# Patient Record
Sex: Female | Born: 1955 | Race: White | Hispanic: No | Smoking: Current some day smoker
Health system: Southern US, Community
[De-identification: ages and names within clinical notes are randomized; demographics above are authoritative.]

## PROBLEM LIST (undated history)

## (undated) DIAGNOSIS — R561 Post traumatic seizures: Secondary | ICD-10-CM

## (undated) DIAGNOSIS — M51369 Other intervertebral disc degeneration, lumbar region without mention of lumbar back pain or lower extremity pain: Secondary | ICD-10-CM

## (undated) DIAGNOSIS — M1990 Unspecified osteoarthritis, unspecified site: Secondary | ICD-10-CM

## (undated) DIAGNOSIS — M419 Scoliosis, unspecified: Secondary | ICD-10-CM

## (undated) DIAGNOSIS — F419 Anxiety disorder, unspecified: Secondary | ICD-10-CM

## (undated) DIAGNOSIS — B1920 Unspecified viral hepatitis C without hepatic coma: Secondary | ICD-10-CM

## (undated) DIAGNOSIS — J449 Chronic obstructive pulmonary disease, unspecified: Secondary | ICD-10-CM

## (undated) DIAGNOSIS — E0500 Thyrotoxicosis with diffuse goiter without thyrotoxic crisis or storm: Secondary | ICD-10-CM

## (undated) DIAGNOSIS — M549 Dorsalgia, unspecified: Secondary | ICD-10-CM

## (undated) DIAGNOSIS — M109 Gout, unspecified: Secondary | ICD-10-CM

## (undated) DIAGNOSIS — F329 Major depressive disorder, single episode, unspecified: Secondary | ICD-10-CM

## (undated) DIAGNOSIS — R413 Other amnesia: Secondary | ICD-10-CM

## (undated) DIAGNOSIS — N183 Chronic kidney disease, stage 3 unspecified: Secondary | ICD-10-CM

## (undated) DIAGNOSIS — G5790 Unspecified mononeuropathy of unspecified lower limb: Secondary | ICD-10-CM

## (undated) DIAGNOSIS — M797 Fibromyalgia: Secondary | ICD-10-CM

## (undated) DIAGNOSIS — G4700 Insomnia, unspecified: Secondary | ICD-10-CM

## (undated) DIAGNOSIS — G8929 Other chronic pain: Secondary | ICD-10-CM

## (undated) DIAGNOSIS — F4310 Post-traumatic stress disorder, unspecified: Secondary | ICD-10-CM

## (undated) DIAGNOSIS — K219 Gastro-esophageal reflux disease without esophagitis: Secondary | ICD-10-CM

## (undated) DIAGNOSIS — G629 Polyneuropathy, unspecified: Secondary | ICD-10-CM

## (undated) DIAGNOSIS — I10 Essential (primary) hypertension: Secondary | ICD-10-CM

## (undated) DIAGNOSIS — M542 Cervicalgia: Secondary | ICD-10-CM

## (undated) DIAGNOSIS — E785 Hyperlipidemia, unspecified: Secondary | ICD-10-CM

## (undated) HISTORY — DX: Essential (primary) hypertension: I10

## (undated) HISTORY — DX: Other intervertebral disc degeneration, lumbar region without mention of lumbar back pain or lower extremity pain: M51.369

## (undated) HISTORY — DX: Scoliosis, unspecified: M41.9

## (undated) HISTORY — DX: Gastro-esophageal reflux disease without esophagitis: K21.9

## (undated) HISTORY — DX: Polyneuropathy, unspecified: G62.9

## (undated) HISTORY — DX: Unspecified osteoarthritis, unspecified site: M19.90

## (undated) HISTORY — DX: Other amnesia: R41.3

## (undated) HISTORY — PX: NECK SURGERY: SHX720

## (undated) HISTORY — DX: Other chronic pain: G89.29

## (undated) HISTORY — DX: Fibromyalgia: M79.7

## (undated) HISTORY — DX: Post-traumatic stress disorder, unspecified: F43.10

## (undated) HISTORY — DX: Gout, unspecified: M10.9

## (undated) HISTORY — DX: Unspecified mononeuropathy of unspecified lower limb: G57.90

## (undated) HISTORY — DX: Major depressive disorder, single episode, unspecified: F32.9

## (undated) HISTORY — DX: Thyrotoxicosis with diffuse goiter without thyrotoxic crisis or storm: E05.00

## (undated) HISTORY — DX: Chronic obstructive pulmonary disease, unspecified: J44.9

## (undated) HISTORY — DX: Anxiety disorder, unspecified: F41.9

## (undated) HISTORY — DX: Hyperlipidemia, unspecified: E78.5

## (undated) HISTORY — DX: Insomnia, unspecified: G47.00

## (undated) HISTORY — PX: OTHER SURGICAL HISTORY: SHX169

## (undated) HISTORY — DX: Chronic kidney disease, stage 3 unspecified: N18.30

## (undated) HISTORY — DX: Unspecified viral hepatitis C without hepatic coma: B19.20

## (undated) HISTORY — DX: Post traumatic seizures: R56.1

## (undated) HISTORY — DX: Dorsalgia, unspecified: M54.9

## (undated) HISTORY — DX: Cervicalgia: M54.2

---

## 1980-12-19 DIAGNOSIS — M1990 Unspecified osteoarthritis, unspecified site: Secondary | ICD-10-CM

## 1980-12-19 HISTORY — DX: Unspecified osteoarthritis, unspecified site: M19.90

## 1996-12-19 DIAGNOSIS — F32A Depression, unspecified: Secondary | ICD-10-CM

## 1996-12-19 HISTORY — DX: Depression, unspecified: F32.A

## 1998-09-02 ENCOUNTER — Ambulatory Visit (HOSPITAL_COMMUNITY): Admission: RE | Admit: 1998-09-02 | Discharge: 1998-09-02 | Payer: Self-pay | Admitting: Neurology

## 1998-11-04 ENCOUNTER — Encounter (HOSPITAL_COMMUNITY): Admission: RE | Admit: 1998-11-04 | Discharge: 1999-02-02 | Payer: Self-pay | Admitting: Gastroenterology

## 2000-03-06 ENCOUNTER — Encounter: Payer: Self-pay | Admitting: *Deleted

## 2000-03-06 ENCOUNTER — Ambulatory Visit (HOSPITAL_COMMUNITY): Admission: RE | Admit: 2000-03-06 | Discharge: 2000-03-06 | Payer: Self-pay | Admitting: *Deleted

## 2000-03-06 ENCOUNTER — Encounter (INDEPENDENT_AMBULATORY_CARE_PROVIDER_SITE_OTHER): Payer: Self-pay

## 2000-04-29 ENCOUNTER — Ambulatory Visit (HOSPITAL_COMMUNITY): Admission: RE | Admit: 2000-04-29 | Discharge: 2000-04-29 | Payer: Self-pay | Admitting: *Deleted

## 2000-04-29 ENCOUNTER — Encounter: Payer: Self-pay | Admitting: *Deleted

## 2001-03-19 ENCOUNTER — Encounter: Admission: RE | Admit: 2001-03-19 | Discharge: 2001-03-19 | Payer: Self-pay | Admitting: Family Medicine

## 2001-05-02 ENCOUNTER — Emergency Department (HOSPITAL_COMMUNITY): Admission: EM | Admit: 2001-05-02 | Discharge: 2001-05-02 | Payer: Self-pay | Admitting: Internal Medicine

## 2001-05-27 ENCOUNTER — Ambulatory Visit (HOSPITAL_COMMUNITY): Admission: RE | Admit: 2001-05-27 | Discharge: 2001-05-27 | Payer: Self-pay | Admitting: Orthopaedic Surgery

## 2001-05-27 ENCOUNTER — Encounter: Payer: Self-pay | Admitting: Orthopaedic Surgery

## 2002-06-10 ENCOUNTER — Encounter: Payer: Self-pay | Admitting: Family Medicine

## 2002-06-10 ENCOUNTER — Ambulatory Visit (HOSPITAL_COMMUNITY): Admission: RE | Admit: 2002-06-10 | Discharge: 2002-06-10 | Payer: Self-pay | Admitting: Family Medicine

## 2002-06-26 ENCOUNTER — Ambulatory Visit (HOSPITAL_COMMUNITY): Admission: RE | Admit: 2002-06-26 | Discharge: 2002-06-26 | Payer: Self-pay | Admitting: Family Medicine

## 2002-06-26 ENCOUNTER — Encounter: Payer: Self-pay | Admitting: Family Medicine

## 2003-01-11 ENCOUNTER — Emergency Department (HOSPITAL_COMMUNITY): Admission: EM | Admit: 2003-01-11 | Discharge: 2003-01-11 | Payer: Self-pay | Admitting: *Deleted

## 2004-05-14 ENCOUNTER — Ambulatory Visit (HOSPITAL_COMMUNITY): Admission: RE | Admit: 2004-05-14 | Discharge: 2004-05-14 | Payer: Self-pay | Admitting: Family Medicine

## 2004-12-19 ENCOUNTER — Emergency Department (HOSPITAL_COMMUNITY): Admission: EM | Admit: 2004-12-19 | Discharge: 2004-12-19 | Payer: Self-pay | Admitting: Emergency Medicine

## 2005-08-04 ENCOUNTER — Ambulatory Visit (HOSPITAL_COMMUNITY): Admission: RE | Admit: 2005-08-04 | Discharge: 2005-08-04 | Payer: Self-pay | Admitting: Family Medicine

## 2005-08-18 ENCOUNTER — Ambulatory Visit (HOSPITAL_COMMUNITY): Admission: RE | Admit: 2005-08-18 | Discharge: 2005-08-18 | Payer: Self-pay | Admitting: Family Medicine

## 2006-01-14 ENCOUNTER — Emergency Department (HOSPITAL_COMMUNITY): Admission: EM | Admit: 2006-01-14 | Discharge: 2006-01-14 | Payer: Self-pay | Admitting: Emergency Medicine

## 2006-09-21 ENCOUNTER — Ambulatory Visit (HOSPITAL_COMMUNITY): Admission: RE | Admit: 2006-09-21 | Discharge: 2006-09-21 | Payer: Self-pay | Admitting: Neurosurgery

## 2006-12-19 DIAGNOSIS — E785 Hyperlipidemia, unspecified: Secondary | ICD-10-CM

## 2006-12-19 DIAGNOSIS — I10 Essential (primary) hypertension: Secondary | ICD-10-CM

## 2006-12-19 HISTORY — DX: Hyperlipidemia, unspecified: E78.5

## 2006-12-19 HISTORY — DX: Essential (primary) hypertension: I10

## 2007-01-05 ENCOUNTER — Emergency Department (HOSPITAL_COMMUNITY): Admission: EM | Admit: 2007-01-05 | Discharge: 2007-01-05 | Payer: Self-pay | Admitting: Emergency Medicine

## 2007-03-23 ENCOUNTER — Emergency Department (HOSPITAL_COMMUNITY): Admission: EM | Admit: 2007-03-23 | Discharge: 2007-03-23 | Payer: Self-pay | Admitting: Emergency Medicine

## 2007-04-01 ENCOUNTER — Emergency Department (HOSPITAL_COMMUNITY): Admission: EM | Admit: 2007-04-01 | Discharge: 2007-04-01 | Payer: Self-pay | Admitting: Family Medicine

## 2007-04-01 ENCOUNTER — Emergency Department (HOSPITAL_COMMUNITY): Admission: EM | Admit: 2007-04-01 | Discharge: 2007-04-01 | Payer: Self-pay | Admitting: Emergency Medicine

## 2007-04-17 ENCOUNTER — Ambulatory Visit (HOSPITAL_COMMUNITY): Admission: RE | Admit: 2007-04-17 | Discharge: 2007-04-17 | Payer: Self-pay | Admitting: Family Medicine

## 2007-05-16 ENCOUNTER — Emergency Department (HOSPITAL_COMMUNITY): Admission: EM | Admit: 2007-05-16 | Discharge: 2007-05-16 | Payer: Self-pay | Admitting: Emergency Medicine

## 2007-07-04 ENCOUNTER — Emergency Department (HOSPITAL_COMMUNITY): Admission: EM | Admit: 2007-07-04 | Discharge: 2007-07-04 | Payer: Self-pay | Admitting: Emergency Medicine

## 2007-08-02 ENCOUNTER — Emergency Department (HOSPITAL_COMMUNITY): Admission: EM | Admit: 2007-08-02 | Discharge: 2007-08-02 | Payer: Self-pay | Admitting: Emergency Medicine

## 2007-08-19 ENCOUNTER — Emergency Department (HOSPITAL_COMMUNITY): Admission: EM | Admit: 2007-08-19 | Discharge: 2007-08-19 | Payer: Self-pay | Admitting: Emergency Medicine

## 2007-09-10 ENCOUNTER — Inpatient Hospital Stay (HOSPITAL_COMMUNITY): Admission: EM | Admit: 2007-09-10 | Discharge: 2007-09-12 | Payer: Self-pay | Admitting: Emergency Medicine

## 2007-09-12 ENCOUNTER — Ambulatory Visit: Payer: Self-pay | Admitting: *Deleted

## 2007-09-12 ENCOUNTER — Inpatient Hospital Stay (HOSPITAL_COMMUNITY): Admission: RE | Admit: 2007-09-12 | Discharge: 2007-09-17 | Payer: Self-pay | Admitting: *Deleted

## 2007-09-24 ENCOUNTER — Encounter
Admission: RE | Admit: 2007-09-24 | Discharge: 2007-12-23 | Payer: Self-pay | Admitting: Physical Medicine & Rehabilitation

## 2007-09-25 ENCOUNTER — Ambulatory Visit: Payer: Self-pay | Admitting: Physical Medicine & Rehabilitation

## 2007-12-31 ENCOUNTER — Other Ambulatory Visit: Admission: RE | Admit: 2007-12-31 | Discharge: 2007-12-31 | Payer: Self-pay | Admitting: Obstetrics and Gynecology

## 2008-02-07 ENCOUNTER — Encounter
Admission: RE | Admit: 2008-02-07 | Discharge: 2008-02-15 | Payer: Self-pay | Admitting: Physical Medicine & Rehabilitation

## 2008-02-07 ENCOUNTER — Ambulatory Visit: Payer: Self-pay | Admitting: Physical Medicine & Rehabilitation

## 2008-03-14 ENCOUNTER — Encounter
Admission: RE | Admit: 2008-03-14 | Discharge: 2008-03-14 | Payer: Self-pay | Admitting: Physical Medicine & Rehabilitation

## 2008-03-14 ENCOUNTER — Ambulatory Visit: Payer: Self-pay | Admitting: Physical Medicine & Rehabilitation

## 2008-04-21 ENCOUNTER — Encounter (HOSPITAL_COMMUNITY): Admission: RE | Admit: 2008-04-21 | Discharge: 2008-05-21 | Payer: Self-pay | Admitting: Neurology

## 2008-04-23 ENCOUNTER — Ambulatory Visit (HOSPITAL_COMMUNITY): Admission: RE | Admit: 2008-04-23 | Discharge: 2008-04-23 | Payer: Self-pay | Admitting: Anesthesiology

## 2008-04-24 ENCOUNTER — Ambulatory Visit (HOSPITAL_COMMUNITY): Admission: RE | Admit: 2008-04-24 | Discharge: 2008-04-24 | Payer: Self-pay | Admitting: Anesthesiology

## 2008-05-13 ENCOUNTER — Ambulatory Visit (HOSPITAL_COMMUNITY): Admission: RE | Admit: 2008-05-13 | Discharge: 2008-05-13 | Payer: Self-pay | Admitting: Obstetrics and Gynecology

## 2008-05-22 ENCOUNTER — Encounter (HOSPITAL_COMMUNITY): Admission: RE | Admit: 2008-05-22 | Discharge: 2008-06-21 | Payer: Self-pay | Admitting: Neurology

## 2008-09-09 ENCOUNTER — Encounter: Admission: RE | Admit: 2008-09-09 | Discharge: 2008-09-09 | Payer: Self-pay | Admitting: Gastroenterology

## 2008-10-22 LAB — HM COLONOSCOPY

## 2009-01-14 ENCOUNTER — Other Ambulatory Visit: Admission: RE | Admit: 2009-01-14 | Discharge: 2009-01-14 | Payer: Self-pay | Admitting: Obstetrics and Gynecology

## 2009-01-28 ENCOUNTER — Emergency Department (HOSPITAL_COMMUNITY): Admission: EM | Admit: 2009-01-28 | Discharge: 2009-01-28 | Payer: Self-pay | Admitting: Emergency Medicine

## 2009-03-31 ENCOUNTER — Ambulatory Visit: Payer: Self-pay | Admitting: Family Medicine

## 2009-03-31 DIAGNOSIS — E785 Hyperlipidemia, unspecified: Secondary | ICD-10-CM | POA: Insufficient documentation

## 2009-03-31 DIAGNOSIS — K219 Gastro-esophageal reflux disease without esophagitis: Secondary | ICD-10-CM | POA: Insufficient documentation

## 2009-03-31 DIAGNOSIS — B1710 Acute hepatitis C without hepatic coma: Secondary | ICD-10-CM

## 2009-03-31 DIAGNOSIS — F341 Dysthymic disorder: Secondary | ICD-10-CM

## 2009-03-31 DIAGNOSIS — I10 Essential (primary) hypertension: Secondary | ICD-10-CM | POA: Insufficient documentation

## 2009-03-31 DIAGNOSIS — E669 Obesity, unspecified: Secondary | ICD-10-CM | POA: Insufficient documentation

## 2009-03-31 DIAGNOSIS — IMO0001 Reserved for inherently not codable concepts without codable children: Secondary | ICD-10-CM

## 2009-03-31 DIAGNOSIS — F17200 Nicotine dependence, unspecified, uncomplicated: Secondary | ICD-10-CM | POA: Insufficient documentation

## 2009-03-31 DIAGNOSIS — M51379 Other intervertebral disc degeneration, lumbosacral region without mention of lumbar back pain or lower extremity pain: Secondary | ICD-10-CM | POA: Insufficient documentation

## 2009-03-31 DIAGNOSIS — R413 Other amnesia: Secondary | ICD-10-CM

## 2009-03-31 DIAGNOSIS — F4310 Post-traumatic stress disorder, unspecified: Secondary | ICD-10-CM

## 2009-03-31 DIAGNOSIS — M5137 Other intervertebral disc degeneration, lumbosacral region: Secondary | ICD-10-CM

## 2009-03-31 DIAGNOSIS — R809 Proteinuria, unspecified: Secondary | ICD-10-CM | POA: Insufficient documentation

## 2009-03-31 DIAGNOSIS — K5900 Constipation, unspecified: Secondary | ICD-10-CM | POA: Insufficient documentation

## 2009-03-31 DIAGNOSIS — S229XXA Fracture of bony thorax, part unspecified, initial encounter for closed fracture: Secondary | ICD-10-CM

## 2009-03-31 LAB — CONVERTED CEMR LAB
Bilirubin Urine: NEGATIVE
Nitrite: NEGATIVE
Specific Gravity, Urine: 1.02
pH: 7

## 2009-04-01 ENCOUNTER — Encounter (INDEPENDENT_AMBULATORY_CARE_PROVIDER_SITE_OTHER): Payer: Self-pay | Admitting: Family Medicine

## 2009-04-02 ENCOUNTER — Encounter (INDEPENDENT_AMBULATORY_CARE_PROVIDER_SITE_OTHER): Payer: Self-pay | Admitting: Family Medicine

## 2009-04-09 ENCOUNTER — Encounter (INDEPENDENT_AMBULATORY_CARE_PROVIDER_SITE_OTHER): Payer: Self-pay | Admitting: Family Medicine

## 2009-04-14 ENCOUNTER — Ambulatory Visit: Payer: Self-pay | Admitting: Family Medicine

## 2009-04-17 ENCOUNTER — Ambulatory Visit (HOSPITAL_COMMUNITY): Admission: RE | Admit: 2009-04-17 | Discharge: 2009-04-17 | Payer: Self-pay | Admitting: Family Medicine

## 2009-04-17 ENCOUNTER — Encounter (INDEPENDENT_AMBULATORY_CARE_PROVIDER_SITE_OTHER): Payer: Self-pay | Admitting: Family Medicine

## 2009-04-20 ENCOUNTER — Ambulatory Visit (HOSPITAL_COMMUNITY): Payer: Self-pay | Admitting: Psychology

## 2009-04-20 ENCOUNTER — Ambulatory Visit: Payer: Self-pay | Admitting: Family Medicine

## 2009-04-20 DIAGNOSIS — M940 Chondrocostal junction syndrome [Tietze]: Secondary | ICD-10-CM

## 2009-04-21 ENCOUNTER — Encounter (INDEPENDENT_AMBULATORY_CARE_PROVIDER_SITE_OTHER): Payer: Self-pay | Admitting: Family Medicine

## 2009-04-22 ENCOUNTER — Encounter (INDEPENDENT_AMBULATORY_CARE_PROVIDER_SITE_OTHER): Payer: Self-pay | Admitting: Family Medicine

## 2009-04-27 ENCOUNTER — Encounter (INDEPENDENT_AMBULATORY_CARE_PROVIDER_SITE_OTHER): Payer: Self-pay | Admitting: Family Medicine

## 2009-05-12 ENCOUNTER — Ambulatory Visit: Payer: Self-pay | Admitting: Family Medicine

## 2009-05-12 ENCOUNTER — Ambulatory Visit (HOSPITAL_COMMUNITY): Payer: Self-pay | Admitting: Psychology

## 2009-05-21 ENCOUNTER — Encounter (INDEPENDENT_AMBULATORY_CARE_PROVIDER_SITE_OTHER): Payer: Self-pay | Admitting: Family Medicine

## 2009-05-25 ENCOUNTER — Encounter (INDEPENDENT_AMBULATORY_CARE_PROVIDER_SITE_OTHER): Payer: Self-pay | Admitting: Family Medicine

## 2009-05-26 LAB — CONVERTED CEMR LAB
ALT: 14 U/L
AST: 21 U/L
Albumin: 4.1 g/dL
Alkaline Phosphatase: 91 U/L
BUN: 6 mg/dL
CO2: 23 meq/L
Calcium: 9.5 mg/dL
Chloride: 105 meq/L
Cholesterol: 185 mg/dL
Creatinine, Ser: 0.63 mg/dL
Glucose, Bld: 89 mg/dL
HCT: 44.9 %
HDL: 40 mg/dL
Hemoglobin: 15.1 g/dL — ABNORMAL HIGH
LDL Cholesterol: 126 mg/dL — ABNORMAL HIGH
MCHC: 33.6 g/dL
MCV: 79.8 fL
Platelets: 265 K/uL
Potassium: 3.9 meq/L
RBC: 5.63 M/uL — ABNORMAL HIGH
RDW: 14.5 %
Sodium: 143 meq/L
TSH: 0.324 u[IU]/mL — ABNORMAL LOW
Total Bilirubin: 0.3 mg/dL
Total CHOL/HDL Ratio: 4.6
Total Protein: 7.2 g/dL
Triglycerides: 96 mg/dL
VLDL: 19 mg/dL
WBC: 5.4 10*3/microliter

## 2009-05-27 ENCOUNTER — Encounter (INDEPENDENT_AMBULATORY_CARE_PROVIDER_SITE_OTHER): Payer: Self-pay | Admitting: Family Medicine

## 2009-05-28 ENCOUNTER — Ambulatory Visit (HOSPITAL_COMMUNITY): Payer: Self-pay | Admitting: Psychiatry

## 2009-06-04 ENCOUNTER — Ambulatory Visit (HOSPITAL_COMMUNITY): Payer: Self-pay | Admitting: Psychology

## 2009-06-09 ENCOUNTER — Ambulatory Visit: Payer: Self-pay | Admitting: Family Medicine

## 2009-06-11 ENCOUNTER — Ambulatory Visit (HOSPITAL_COMMUNITY): Payer: Self-pay | Admitting: Psychiatry

## 2009-06-15 ENCOUNTER — Telehealth (INDEPENDENT_AMBULATORY_CARE_PROVIDER_SITE_OTHER): Payer: Self-pay | Admitting: *Deleted

## 2009-06-15 DIAGNOSIS — E079 Disorder of thyroid, unspecified: Secondary | ICD-10-CM | POA: Insufficient documentation

## 2009-06-16 ENCOUNTER — Ambulatory Visit (HOSPITAL_COMMUNITY): Payer: Self-pay | Admitting: Psychology

## 2009-07-09 ENCOUNTER — Ambulatory Visit (HOSPITAL_COMMUNITY): Payer: Self-pay | Admitting: Psychiatry

## 2009-07-21 ENCOUNTER — Ambulatory Visit: Payer: Self-pay | Admitting: Family Medicine

## 2009-07-21 DIAGNOSIS — K5909 Other constipation: Secondary | ICD-10-CM

## 2009-07-21 DIAGNOSIS — H5310 Unspecified subjective visual disturbances: Secondary | ICD-10-CM | POA: Insufficient documentation

## 2009-07-22 ENCOUNTER — Encounter (INDEPENDENT_AMBULATORY_CARE_PROVIDER_SITE_OTHER): Payer: Self-pay | Admitting: *Deleted

## 2009-07-22 ENCOUNTER — Encounter (INDEPENDENT_AMBULATORY_CARE_PROVIDER_SITE_OTHER): Payer: Self-pay | Admitting: Family Medicine

## 2009-08-14 ENCOUNTER — Ambulatory Visit (HOSPITAL_COMMUNITY): Payer: Self-pay | Admitting: Psychology

## 2009-09-04 ENCOUNTER — Ambulatory Visit (HOSPITAL_COMMUNITY): Payer: Self-pay | Admitting: Psychology

## 2009-09-11 ENCOUNTER — Ambulatory Visit: Payer: Self-pay | Admitting: Internal Medicine

## 2009-09-11 DIAGNOSIS — R1310 Dysphagia, unspecified: Secondary | ICD-10-CM | POA: Insufficient documentation

## 2009-09-11 DIAGNOSIS — R634 Abnormal weight loss: Secondary | ICD-10-CM | POA: Insufficient documentation

## 2009-09-14 ENCOUNTER — Encounter: Payer: Self-pay | Admitting: Internal Medicine

## 2009-09-15 ENCOUNTER — Encounter (INDEPENDENT_AMBULATORY_CARE_PROVIDER_SITE_OTHER): Payer: Self-pay | Admitting: Family Medicine

## 2009-10-01 ENCOUNTER — Ambulatory Visit: Payer: Self-pay | Admitting: Internal Medicine

## 2009-10-01 ENCOUNTER — Ambulatory Visit (HOSPITAL_COMMUNITY): Admission: RE | Admit: 2009-10-01 | Discharge: 2009-10-01 | Payer: Self-pay | Admitting: Internal Medicine

## 2009-10-01 ENCOUNTER — Encounter: Payer: Self-pay | Admitting: Internal Medicine

## 2009-10-01 LAB — HM COLONOSCOPY

## 2009-10-03 ENCOUNTER — Encounter: Payer: Self-pay | Admitting: Internal Medicine

## 2009-10-20 ENCOUNTER — Ambulatory Visit (HOSPITAL_COMMUNITY): Payer: Self-pay | Admitting: Psychiatry

## 2009-10-26 ENCOUNTER — Ambulatory Visit (HOSPITAL_COMMUNITY): Payer: Self-pay | Admitting: Psychology

## 2009-12-02 ENCOUNTER — Ambulatory Visit (HOSPITAL_COMMUNITY): Payer: Self-pay | Admitting: Psychology

## 2009-12-22 ENCOUNTER — Ambulatory Visit (HOSPITAL_COMMUNITY): Payer: Self-pay | Admitting: Psychiatry

## 2009-12-28 ENCOUNTER — Encounter: Payer: Self-pay | Admitting: Gastroenterology

## 2009-12-30 ENCOUNTER — Encounter: Payer: Self-pay | Admitting: Gastroenterology

## 2009-12-30 ENCOUNTER — Ambulatory Visit (HOSPITAL_COMMUNITY): Payer: Self-pay | Admitting: Psychology

## 2009-12-31 ENCOUNTER — Encounter: Payer: Self-pay | Admitting: Gastroenterology

## 2010-01-26 ENCOUNTER — Ambulatory Visit: Payer: Self-pay | Admitting: Family Medicine

## 2010-01-26 DIAGNOSIS — J209 Acute bronchitis, unspecified: Secondary | ICD-10-CM | POA: Insufficient documentation

## 2010-01-26 LAB — CONVERTED CEMR LAB
Bilirubin Urine: NEGATIVE
Blood in Urine, dipstick: NEGATIVE
Glucose, Urine, Semiquant: NEGATIVE
Protein, U semiquant: NEGATIVE
Urobilinogen, UA: 0.2
pH: 5.5

## 2010-02-09 ENCOUNTER — Telehealth: Payer: Self-pay | Admitting: Family Medicine

## 2010-02-16 ENCOUNTER — Ambulatory Visit (HOSPITAL_COMMUNITY): Payer: Self-pay | Admitting: Psychiatry

## 2010-02-17 ENCOUNTER — Telehealth: Payer: Self-pay | Admitting: Family Medicine

## 2010-02-19 ENCOUNTER — Ambulatory Visit (HOSPITAL_COMMUNITY): Admission: RE | Admit: 2010-02-19 | Discharge: 2010-02-19 | Payer: Self-pay | Admitting: Family Medicine

## 2010-02-22 ENCOUNTER — Ambulatory Visit: Payer: Self-pay | Admitting: Family Medicine

## 2010-02-22 LAB — CONVERTED CEMR LAB
AST: 16 units/L (ref 0–37)
Albumin: 4.4 g/dL (ref 3.5–5.2)
CO2: 25 meq/L (ref 19–32)
Chloride: 104 meq/L (ref 96–112)
Glucose, Bld: 76 mg/dL (ref 70–99)
HDL: 51 mg/dL (ref >39)
LDL Cholesterol: 123 mg/dL — ABNORMAL HIGH (ref 0–99)
Potassium: 4 meq/L (ref 3.5–5.3)
Sodium: 139 meq/L (ref 135–145)
Total Bilirubin: 0.4 mg/dL (ref 0.3–1.2)
Total CHOL/HDL Ratio: 3.8
VLDL: 21 mg/dL (ref 0–40)

## 2010-03-11 ENCOUNTER — Encounter (INDEPENDENT_AMBULATORY_CARE_PROVIDER_SITE_OTHER): Payer: Self-pay | Admitting: *Deleted

## 2010-03-11 ENCOUNTER — Telehealth: Payer: Self-pay | Admitting: Family Medicine

## 2010-03-23 ENCOUNTER — Ambulatory Visit: Payer: Self-pay | Admitting: Family Medicine

## 2010-03-23 DIAGNOSIS — M25569 Pain in unspecified knee: Secondary | ICD-10-CM | POA: Insufficient documentation

## 2010-03-26 ENCOUNTER — Telehealth: Payer: Self-pay | Admitting: Family Medicine

## 2010-03-30 ENCOUNTER — Ambulatory Visit (HOSPITAL_COMMUNITY): Payer: Self-pay | Admitting: Psychiatry

## 2010-04-01 ENCOUNTER — Ambulatory Visit (HOSPITAL_COMMUNITY): Payer: Self-pay | Admitting: Psychology

## 2010-06-10 ENCOUNTER — Ambulatory Visit (HOSPITAL_COMMUNITY): Payer: Self-pay | Admitting: Psychiatry

## 2010-07-13 ENCOUNTER — Other Ambulatory Visit: Admission: RE | Admit: 2010-07-13 | Discharge: 2010-07-13 | Payer: Self-pay | Admitting: Family Medicine

## 2010-07-13 ENCOUNTER — Ambulatory Visit: Payer: Self-pay | Admitting: Family Medicine

## 2010-07-13 DIAGNOSIS — J42 Unspecified chronic bronchitis: Secondary | ICD-10-CM | POA: Insufficient documentation

## 2010-07-13 LAB — CONVERTED CEMR LAB: OCCULT 1: NEGATIVE

## 2010-07-15 ENCOUNTER — Encounter: Payer: Self-pay | Admitting: Family Medicine

## 2010-07-15 LAB — CONVERTED CEMR LAB: Pap Smear: NEGATIVE

## 2010-07-18 DIAGNOSIS — B351 Tinea unguium: Secondary | ICD-10-CM | POA: Insufficient documentation

## 2010-07-18 DIAGNOSIS — B369 Superficial mycosis, unspecified: Secondary | ICD-10-CM | POA: Insufficient documentation

## 2010-07-22 ENCOUNTER — Ambulatory Visit (HOSPITAL_COMMUNITY): Admission: RE | Admit: 2010-07-22 | Discharge: 2010-07-22 | Payer: Self-pay | Admitting: Family Medicine

## 2010-08-31 ENCOUNTER — Encounter: Payer: Self-pay | Admitting: Family Medicine

## 2010-09-07 ENCOUNTER — Ambulatory Visit (HOSPITAL_COMMUNITY): Payer: Self-pay | Admitting: Psychiatry

## 2010-09-28 ENCOUNTER — Telehealth: Payer: Self-pay | Admitting: Family Medicine

## 2010-10-12 ENCOUNTER — Telehealth: Payer: Self-pay | Admitting: Family Medicine

## 2010-10-15 ENCOUNTER — Encounter: Payer: Self-pay | Admitting: Family Medicine

## 2010-11-08 ENCOUNTER — Telehealth: Payer: Self-pay | Admitting: Family Medicine

## 2010-11-09 ENCOUNTER — Ambulatory Visit (HOSPITAL_COMMUNITY): Payer: Self-pay | Admitting: Psychiatry

## 2010-11-15 ENCOUNTER — Ambulatory Visit: Payer: Self-pay | Admitting: Family Medicine

## 2010-11-15 DIAGNOSIS — K1370 Unspecified lesions of oral mucosa: Secondary | ICD-10-CM

## 2010-11-20 ENCOUNTER — Emergency Department (HOSPITAL_COMMUNITY)
Admission: EM | Admit: 2010-11-20 | Discharge: 2010-11-20 | Payer: Self-pay | Source: Home / Self Care | Admitting: Emergency Medicine

## 2010-11-20 ENCOUNTER — Encounter: Payer: Self-pay | Admitting: Family Medicine

## 2010-11-23 ENCOUNTER — Ambulatory Visit: Payer: Self-pay | Admitting: Family Medicine

## 2010-11-23 DIAGNOSIS — R079 Chest pain, unspecified: Secondary | ICD-10-CM | POA: Insufficient documentation

## 2010-11-24 ENCOUNTER — Encounter: Payer: Self-pay | Admitting: Family Medicine

## 2010-11-25 ENCOUNTER — Ambulatory Visit: Payer: Self-pay | Admitting: Cardiology

## 2010-11-29 ENCOUNTER — Telehealth: Payer: Self-pay | Admitting: Family Medicine

## 2010-11-29 DIAGNOSIS — R0789 Other chest pain: Secondary | ICD-10-CM | POA: Insufficient documentation

## 2010-11-29 DIAGNOSIS — I2089 Other forms of angina pectoris: Secondary | ICD-10-CM | POA: Insufficient documentation

## 2010-11-30 ENCOUNTER — Encounter: Payer: Self-pay | Admitting: Cardiology

## 2010-12-09 ENCOUNTER — Telehealth: Payer: Self-pay | Admitting: Family Medicine

## 2010-12-13 ENCOUNTER — Emergency Department (HOSPITAL_COMMUNITY)
Admission: EM | Admit: 2010-12-13 | Discharge: 2010-12-13 | Payer: Self-pay | Source: Home / Self Care | Admitting: Emergency Medicine

## 2011-01-04 ENCOUNTER — Ambulatory Visit (HOSPITAL_COMMUNITY)
Admission: RE | Admit: 2011-01-04 | Discharge: 2011-01-04 | Payer: Self-pay | Source: Home / Self Care | Attending: Psychiatry | Admitting: Psychiatry

## 2011-01-09 ENCOUNTER — Encounter: Payer: Self-pay | Admitting: Obstetrics & Gynecology

## 2011-01-09 ENCOUNTER — Encounter: Payer: Self-pay | Admitting: Family Medicine

## 2011-01-18 NOTE — Progress Notes (Signed)
Summary: NO SHOW  Phone Note Call from Patient   Summary of Call: LABAUER CALLED SHE WAS A NO SHOW Initial call taken by: Lind Guest,  March 11, 2010 1:42 PM  Follow-up for Phone Call        noted Follow-up by: Syliva Overman MD,  March 11, 2010 4:31 PM

## 2011-01-18 NOTE — Miscellaneous (Signed)
Summary: Immunization Entry   Immunization History:  Influenza Immunization History:    Influenza:  historical (10/12/2010) [facility]

## 2011-01-18 NOTE — Progress Notes (Signed)
Summary: rx  Phone Note Call from Patient   Summary of Call: did not get to see dr. Hilda Lias today because she did not have her co pay and wants to know will you call in some voltaren gl at [facility] Initial call taken by: Lind Guest,  March 26, 2010 1:20 PM  Follow-up for Phone Call        pls let her know the med requested has been sent in Follow-up by: Syliva Overman MD,  March 28, 2010 9:21 PM  Additional Follow-up for Phone Call Additional follow up Details #1::        PATIENT AWARE Additional Follow-up by: Lind Guest,  March 29, 2010 8:39 AM    New/Updated Medications: VOLTAREN 1 % GEL (DICLOFENAC SODIUM) apply twice daily to affected area(s) as needed Prescriptions: VOLTAREN 1 % GEL (DICLOFENAC SODIUM) apply twice daily to affected area(s) as needed  #45 gm x 2   Entered and Authorized by:   Syliva Overman MD   Signed by:   Syliva Overman MD on 03/28/2010   Method used:   Electronically to        [facility] [address] [phone]* (retail)       [address] [place]  [address]       Ph: [phone]       Fax: [phone]   RxID:   [id number]

## 2011-01-18 NOTE — Progress Notes (Signed)
Summary: medication  Phone Note Call from Patient   Summary of Call: patient needs all her medication faxed into her pharmacy which is [facility] [phone] Initial call taken by: Curtis Sites,  November 08, 2010 3:54 PM  Follow-up for Phone Call        noted Follow-up by: Adella Hare LPN,  November 09, 2010 6:20 PM

## 2011-01-18 NOTE — Letter (Signed)
Summary: dr. Lolly Mustache  dr. Lolly Mustache   Imported By: Lind Guest 11/24/2010 10:47:32  _____________________________________________________________________  External Attachment:    Type:   Image     Comment:   External Document
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
## 2011-01-18 NOTE — Progress Notes (Signed)
Summary: RX  Phone Note Call from Patient   Summary of Call: [facility] RECEIVED THE MEDICINE SENT ON 2.8.11 AND SHE NEEDS HER LISINOPRIL SEND ALSO Initial call taken by: Lind Guest,  February 09, 2010 11:14 AM  Follow-up for Phone Call        Rx Called In Follow-up by: Adella Hare LPN,  February 09, 2010 12:07 PM
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
## 2011-01-18 NOTE — Assessment & Plan Note (Signed)
Summary: office visit   Vital Signs:  Patient profile:   55 year old female Menstrual status:  postmenopausal Height:      71 inches Weight:      249.25 pounds BMI:     34.89 O2 Sat:      95 % on Room air Pulse rate:   71 / minute Resp:     16 per minute BP sitting:   140 / 80  (left arm)  Vitals Entered By: Adella Hare LPN (November 15, 2010 9:29 AM)  O2 Flow:  Room air CC: follow up and sore in mouth and rash on right arm Is Patient Diabetic? No Pain Assessment Patient in pain? no        Primary Care Provider:  Syliva Overman MD  CC:  follow up and sore in mouth and rash on right arm.  History of Present Illness: Reports  that she is doing about the same a usual. She does have some new concerns about skin lesions, but otherwise,nothing has changed  Denies recent fever or chills. Denies sinus pressure, nasal congestion , ear pain or sore throat. Denies chest congestion, or cough productive of sputum. Denies chest pain, palpitations, PND, orthopnea or leg swelling. Denies abdominal pain, nausea, vomitting, diarrhea or constipation. Denies change in bowel movements or bloody stool. Denies dysuria , frequency, incontinence or hesitancy.  Denies headaches, vertigo, seizures.      Preventive Screening-Counseling & Management  Alcohol-Tobacco     Smoking Cessation Counseling: yes  Current Medications (verified): 1)  Cymbalta 60 Mg Cpep (Duloxetine Hcl) .... One Tab By Mouth Once Daily 2)  Alprazolam 0.5 Mg Tabs (Alprazolam) .... Take 1-2 Tabs Once Daily 3)  Prilosec 20 Mg Cpdr (Omeprazole) .... Two Times A Day 4)  Lisinopril 20 Mg Tabs (Lisinopril) .... One Daly 5)  Zocor 20 Mg Tabs (Simvastatin) .... Once Daily 6)  Oxycodone Hcl 15 Mg Tabs (Oxycodone Hcl) .... One Every 6 Hours As Needed 7)  Opana Er 20 Mg Xr12h-Tab (Oxymorphone Hcl) .... Two Times A Day 8)  Proair Hfa 108 (90 Base) Mcg/act Aers (Albuterol Sulfate) .... 2 Puffs Every 6 Hours As Needed 9)   Miralax  Powd (Polyethylene Glycol 3350) .Marland KitchenMarland KitchenMarland Kitchen 17 G in 8 Oz of H20 Every Other Day 10)  Doxepin Hcl 150 Mg Caps (Doxepin Hcl) .... Two Caps By Mouth At Bedtime 11)  Voltaren 1 % Gel (Diclofenac Sodium) .... Apply Twice Daily To Affected Area(S) As Needed 12)  Clotrimazole-Betamethasone 1-0.05 % Crea (Clotrimazole-Betamethasone) .... Apply Twice Daily To Rash For 1 Week, Then As Needed 13)  Spiriva Handihaler 18 Mcg Caps (Tiotropium Bromide Monohydrate) .... One Inhalation Once Daily  Allergies (verified): No Known Drug Allergies  Review of Systems      See HPI General:  Complains of fatigue. Eyes:  Denies eye pain and red eye. MS:  Complains of joint pain, low back pain, mid back pain, and stiffness. Derm:  Complains of lesion(s); painless lesion in left cheek in mouth x 6 months, no bleeding, no change in size per pt, was originally white, now pink, also rash on right arm, clear, non puritic right great toenail still thickened . Psych:  Complains of anxiety, depression, and mental problems; denies suicidal thoughts/plans, thoughts of violence, and unusual visions or sounds. Endo:  Denies cold intolerance, excessive hunger, excessive thirst, and excessive urination. Heme:  Denies abnormal bruising and bleeding. Allergy:  Denies hives or rash and itching eyes.  Physical Exam  General:  Well-developed,obese,in no  acute distress; alert,appropriate and cooperative throughout examination HEENT: No facial asymmetry,  EOMI, No sinus tenderness, TM's Clear, oropharynx  pink and moist. flesh colored growth inside of mouth  Chest: Clear to auscultation bilaterally. Reduced air entry throughout CVS: S1, S2, No murmurs, No S3.   Abd: Soft, Nontender.  ON:[id number]  ROM spine, hips, shoulders and knees.  Ext: No edema.   CNS: CN 2-12 intact, power tone and sensation normal throughout.   Skin: Intact, fungal infection on arm   Psych: Good eye contact, normal affect.  Memory loss, not anxious or  depressed appearing.    Impression & Recommendations:  Problem # 1:  OTHER&UNSPECIFIED DISEASES THE ORAL SOFT TISSUES (ICD-528.9) Assessment Comment Only  Orders: Dental Referral (Dentist)  Problem # 2:  DERMATOMYCOSIS (ICD-111.9) Assessment: Comment Only  The following medications were removed from the medication list:    Terbinafine Hcl 250 Mg Tabs (Terbinafine hcl) .Marland Kitchen... Take 1 tablet by mouth two times a day Her updated medication list for this problem includes:    Clotrimazole-betamethasone 1-0.05 % Crea (Clotrimazole-betamethasone) .Marland Kitchen... Apply twice daily to rash for 1 week, then as needed  Problem # 3:  TOBACCO USER (ICD-305.1) Assessment: Unchanged  Encouraged smoking cessation and discussed different methods for smoking cessation.   Problem # 4:  DEGENERATIVE DISC DISEASE, LUMBAR SPINE (ICD-722.52) Assessment: Unchanged pain mx per neurology  Problem # 5:  ANXIETY DEPRESSION (ICD-300.4) Assessment: Improved followed by mental health  Problem # 6:  HYPERTENSION (ICD-401.9) Assessment: Deteriorated  Her updated medication list for this problem includes:    Lisinopril 20 Mg Tabs (Lisinopril) ..... One daly  Orders: T-Basic Metabolic Panel [phone])  BP today: 140/80 Prior BP: 122/78 (07/13/2010)  Prior 10 Yr Risk Heart Disease: 13 % (06/09/2009)  Labs Reviewed: K+: 4.0 (02/16/2010) Creat: : 0.77 (02/16/2010)   Chol: 195 (02/16/2010)   HDL: 51 (02/16/2010)   LDL: 123 (02/16/2010)   TG: 105 (02/16/2010)  Problem # 7:  HYPERLIPIDEMIA (ICD-272.4) Assessment: Comment Only  The following medications were removed from the medication list:    Zocor 20 Mg Tabs (Simvastatin) ..... Once daily Her updated medication list for this problem includes:    Pravastatin Sodium 40 Mg Tabs (Pravastatin sodium) .Marland Kitchen... Take 1 tab by mouth at bedtime  Orders: Medicare Electronic Prescription [phone]) T-Lipid Profile [phone])  Labs Reviewed: SGOT: 16 (02/16/2010)    SGPT: 9 (02/16/2010)  Lipid Goals: Chol Goal: 200 (04/14/2009)   HDL Goal: 40 (04/14/2009)   LDL Goal: 130 (04/14/2009)   TG Goal: 150 (04/14/2009)  Prior 10 Yr Risk Heart Disease: 13 % (06/09/2009)   HDL:51 (02/16/2010), 40 (05/25/2009)  LDL:123 (02/16/2010), 126 (05/25/2009)  Chol:195 (02/16/2010), 185 (05/25/2009)  Trig:105 (02/16/2010), 96 (05/25/2009)  Complete Medication List: 1)  Cymbalta 60 Mg Cpep (Duloxetine hcl) .... One tab by mouth once daily 2)  Alprazolam 0.5 Mg Tabs (Alprazolam) .... Take 1-2 tabs once daily 3)  Prilosec 20 Mg Cpdr (Omeprazole) .... Two times a day 4)  Lisinopril 20 Mg Tabs (Lisinopril) .... One daly 5)  Oxycodone Hcl 15 Mg Tabs (Oxycodone hcl) .... One every 6 hours as needed 6)  Opana Er 20 Mg Xr12h-tab (Oxymorphone hcl) .... Two times a day 7)  Proair Hfa 108 (90 Base) Mcg/act Aers (Albuterol sulfate) .... 2 puffs every 6 hours as needed 8)  Miralax Powd (Polyethylene glycol 3350) .Marland KitchenMarland KitchenMarland Kitchen 17 g in 8 oz of h20 every other day 9)  Doxepin Hcl 150 Mg Caps (Doxepin hcl) .... Two caps by mouth  at bedtime 10)  Voltaren 1 % Gel (Diclofenac sodium) .... Apply twice daily to affected area(s) as needed 11)  Clotrimazole-betamethasone 1-0.05 % Crea (Clotrimazole-betamethasone) .... Apply twice daily to rash for 1 week, then as needed 12)  Spiriva Handihaler 18 Mcg Caps (Tiotropium bromide monohydrate) .... One inhalation once daily 13)  Pravastatin Sodium 40 Mg Tabs (Pravastatin sodium) .... Take 1 tab by mouth at bedtime  Patient Instructions: 1)  Follow up appointment in 5.24months 2)  It is important that you exercise regularly at least 20 minutes 5 times a week. If you develop chest pain, have severe difficulty breathing, or feel very tired , stop exercising immediately and seek medical attention. 3)  You need to lose weight. Consider a lower calorie diet and regular exercise.  4)  BMP prior to visit, ICD-9: 5)  Lipid Panel prior to visit, ICD-9:  fasting asap 6)   pLS stop the simvastatin, new med is pravastatin. 7)  you will be referred for oral surgeon to eval the lesion in your mouth 8)  Tobacco is very bad for your health and your loved ones! You Should stop smoking!. 9)  Stop Smoking Tips: Choose a Quit date. Cut down before the Quit date. decide what you will do as a substitute when you feel the urge to smoke(gum,toothpick,exercise). Prescriptions: SPIRIVA HANDIHALER 18 MCG CAPS (TIOTROPIUM BROMIDE MONOHYDRATE) one inhalation once daily  #3 x 1   Entered by:   Adella Hare LPN   Authorized by:   Syliva Overman MD   Signed by:   Adella Hare LPN on 47/42/5956   Method used:   Faxed to ...       [facility] (mail-order)             , [place]         Ph: [phone]       Fax: [phone]   RxID:   [id number] VOLTAREN 1 % GEL (DICLOFENAC SODIUM) apply twice daily to affected area(s) as needed  #7mnth supp x 1   Entered by:   Adella Hare LPN   Authorized by:   Syliva Overman MD   Signed by:   Adella Hare LPN on 20/25/4270   Method used:   Faxed to ...       [facility] (mail-order)             , [place]         Ph: [phone]       Fax: [phone]   RxID:   [id number] PROAIR HFA 108 (90 BASE) MCG/ACT AERS (ALBUTEROL SULFATE) 2 puffs every 6 hours as needed  #3 x 1   Entered by:   Adella Hare LPN   Authorized by:   Syliva Overman MD   Signed by:   Adella Hare LPN on 09/38/1829   Method used:   Faxed to ...       [facility] (mail-order)             , [place]         Ph: [phone]       Fax: [phone]   RxID:   [id number] LISINOPRIL 20 MG TABS (LISINOPRIL) One daly  #90 Each x 1   Entered by:   Adella Hare LPN   Authorized by:   Syliva Overman MD   Signed by:   Adella Hare LPN on 43/15/4008   Method used:   Faxed to ...       [facility] Environmental education officer)             ,  [place]         Ph: [phone]       Fax: [phone]   RxID:   [id number] PRILOSEC 20 MG CPDR (OMEPRAZOLE)  two times a day  #180 Each x 1   Entered by:   Adella Hare LPN   Authorized by:   Syliva Overman MD   Signed by:   Adella Hare LPN on 02/72/5366   Method used:   Faxed to ...       [facility] (mail-order)             , [place]         Ph: [phone]       Fax: [phone]   RxID:   [id number] PRAVASTATIN SODIUM 40 MG TABS (PRAVASTATIN SODIUM) Take 1 tab by mouth at bedtime  #90 x 1   Entered and Authorized by:   Syliva Overman MD   Signed by:   Syliva Overman MD on 11/15/2010   Method used:   Printed then faxed to ...       [facility]  [address] (retail)       [address]       [place], [place]  [address]       Ph: [phone]       Fax: [phone]   RxID:   [id number]    Orders Added: 1)  Est. Patient Level IV [62694] 2)  Medicare Electronic Prescription [G8553] 3)  T-Basic Metabolic Panel [phone] 4)  T-Lipid Profile [80061-22930] 5)  Dental Referral [Dentist]

## 2011-01-18 NOTE — Progress Notes (Signed)
Summary: inhaler  Phone Note Call from Patient   Summary of Call: pt states doc wrote her a rx for inhaler but pharm don't have it. [phone] Initial call taken by: Rudene Anda,  February 17, 2010 4:37 PM    Prescriptions: PROAIR HFA 108 (90 BASE) MCG/ACT AERS (ALBUTEROL SULFATE) 2 puffs every 6 hours as needed  #1 x 0   Entered by:   Everitt Amber LPN   Authorized by:   Syliva Overman MD   Signed by:   Everitt Amber LPN on 08/65/7846   Method used:   Electronically to        [facility] [address] [phone]* (retail)       [address] [place]  [address]       Ph: [phone]       Fax: [phone]   RxID:   [id number]

## 2011-01-18 NOTE — Progress Notes (Signed)
Summary: paper  Phone Note Call from Patient   Summary of Call: left message wanting to know is the paper ready that she brought by about her licenses call back at [phone] Initial call taken by: Lind Guest,  September 28, 2010 12:54 PM  Follow-up for Phone Call        I have not seen any form for her license. Do you have something to be filled out Follow-up by: Everitt Amber LPN,  September 28, 2010 4:52 PM  Additional Follow-up for Phone Call Additional follow up Details #1::        the license was filled out long ago that's the only one I know about it she should have been sent Additional Follow-up by: Syliva Overman MD,  September 28, 2010 5:01 PM    Additional Follow-up for Phone Call Additional follow up Details #2::    noted Follow-up by: Everitt Amber LPN,  October 01, 2010 4:41 PM

## 2011-01-18 NOTE — Progress Notes (Signed)
Summary: PAPERS  Phone Note Call from Patient   Summary of Call: CALLED BACK AND SAID THAT SHE WOULD MAIL THEM HERE Initial call taken by: Lind Guest,  March 26, 2010 8:52 AM  Follow-up for Phone Call        noted Follow-up by: Syliva Overman MD,  March 26, 2010 12:23 PM
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
## 2011-01-18 NOTE — Medication Information (Signed)
Summary: Tax adviser   Imported By: Diana Eves 12/30/2009 10:15:54  _____________________________________________________________________  External Attachment:    Type:   Image     Comment:   External Document  Appended Document: RX Folder addressed

## 2011-01-18 NOTE — Letter (Signed)
Summary: Historic Patient File  Historic Patient File   Imported By: Lind Guest 07/13/2010 13:57:27  _____________________________________________________________________  External Attachment:    Type:   Image     Comment:   External Document
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
## 2011-01-18 NOTE — Letter (Signed)
Summary: lab staff  lab staff   Imported By: Lind Guest 07/15/2010 13:14:25  _____________________________________________________________________  External Attachment:    Type:   Image     Comment:   External Document

## 2011-01-18 NOTE — Letter (Signed)
Summary: Appointment - Missed  [facility]  [address], [place] [address]   Phone: [phone]  Fax: [phone]     March 11, 2010 MRN: [id number]   Penny Cox [address] [place], [place]  [address]   Dear [name],  Our records indicate you missed your appointment on   03/11/10 DR MCDOWELL                                It is very important that we reach you to reschedule this appointment. We look forward to participating in your health care needs. Please contact us at the number listed above at your earliest convenience to reschedule this appointment.   We have also have notified you refering physican.     Sincerely,    Glass blower/designer

## 2011-01-18 NOTE — Letter (Signed)
Summary: Letter  Letter   Imported By: Lind Guest 07/15/2010 15:45:37  _____________________________________________________________________  External Attachment:    Type:   Image     Comment:   External Document

## 2011-01-18 NOTE — Medication Information (Signed)
Summary: Tax adviser   Imported By: Ricard Dillon 12/31/2009 11:34:37  _____________________________________________________________________  External Attachment:    Type:   Image     Comment:   External Document  Appended Document: RX Folder duplicate

## 2011-01-18 NOTE — Letter (Signed)
Summary: Out of Work  [facility]  [address]   [place], [place] [address]   Phone: [phone]  Fax: [phone]    January 26, 2010   Employee:  [employer]HUM BETTEN    To Whom It May Concern:   For Medical reasons, please excuse the above named employee from work for the following dates:  Start:   01/26/10  End:   01/27/10  to return with no restrictions  If you need additional information, please feel free to contact our office.         Sincerely,    Milus Mallick. Lodema Hong, MD  Appended Document: Out of Work wrong patient!!!!

## 2011-01-18 NOTE — Medication Information (Signed)
Summary: Tax adviser   Imported By: Lind Guest 08/31/2010 15:28:49  _____________________________________________________________________  External Attachment:    Type:   Image     Comment:   External Document

## 2011-01-18 NOTE — Progress Notes (Signed)
Summary: medication  Phone Note Call from Patient   Summary of Call: patient states she needs an oinment for styes called in she had a presciption for  bacitracin zinc poly myxin B sulfate ophitalmic oinment usp, she states Dr. Lodema Hong has never prescribed before, she states medication normally last a long time.  Please advise Initial call taken by: Curtis Sites,  October 12, 2010 9:46 AM  Follow-up for Phone Call        i do not see the specific prep she is requesting on electronic record, since for her eye, i suggest she call an eye doc, in the meantime apply warm compress 3 times daily to stye Follow-up by: Syliva Overman MD,  October 12, 2010 12:50 PM  Additional Follow-up for Phone Call Additional follow up Details #1::        returned call, busy signal  Additional Follow-up by: Adella Hare LPN,  October 13, 2010 9:41 AM    Additional Follow-up for Phone Call Additional follow up Details #2::    patient aware Follow-up by: Adella Hare LPN,  October 13, 2010 2:17 PM  Prescriptions: TERBINAFINE HCL 250 MG TABS (TERBINAFINE HCL) Take 1 tablet by mouth two times a day  #60 x 2   Entered by:   Adella Hare LPN   Authorized by:   Syliva Overman MD   Signed by:   Adella Hare LPN on 16/09/9603   Method used:   Electronically to        [facility]  [address] (retail)       [address]       [place], [place]  [address]       Ph: [phone]       Fax: [phone]   RxID:   [id number]

## 2011-01-20 NOTE — Progress Notes (Signed)
Summary: REFILL ON MEDS  Phone Note Call from Patient   Summary of Call: PATIENT REQUEST FOR REFILL ON MIRALAX PLEASE FAX TO  [facility]  Initial call taken by: Eugenio Hoes,  December 09, 2010 1:37 PM    Prescriptions: MIRALAX  POWD (POLYETHYLENE GLYCOL 3350) 17 g in 8 oz of H20 every other day  #90 day supp x 0   Entered by:   Adella Hare LPN   Authorized by:   Syliva Overman MD   Signed by:   Adella Hare LPN on 16/09/9603   Method used:   Faxed to ...       [facility] (mail-order)             , [place]         Ph: [phone]       Fax: [phone]   RxID:   [id number]
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
## 2011-01-20 NOTE — Progress Notes (Signed)
Summary: MEDICATION  Phone Note Call from Patient   Summary of Call: PATIENT CALLED IN WANTED TO KNOW COULD SHE GET SOMETHING TO HELP HER TO STOP  SMOKING REQUESTED FOR.Marland Kitchen [facility])  Initial call taken by: Eugenio Hoes,  November 29, 2010 4:34 PM  Follow-up for Phone Call        pls let her know I do not prescribe chantix since linked to higher risk of heart diseaese, pls let me know if she will use the nicotrol inhaler so I can prescribe it , unsure if covered however Follow-up by: Syliva Overman MD,  November 30, 2010 6:15 AM  Additional Follow-up for Phone Call Additional follow up Details #1::        patient states go ahead and send in inhaler Additional Follow-up by: Adella Hare LPN,  November 30, 2010 12:11 PM    Additional Follow-up for Phone Call Additional follow up Details #2::    as far as I know the patch is covered it has been entered historically , pls fax the 3 scripts after you spk with her Follow-up by: Syliva Overman MD,  December 03, 2010 4:34 PM  New/Updated Medications: NICODERM CQ 21 MG/24HR PT24 (NICOTINE) apply one patch once daily for 2 weeks, do not smoke while using, start 12/19 2011 NICODERM CQ 14 MG/24HR PT24 (NICOTINE) apply one patch once daily for 2 weeks, start 12/20/2010, do not smoke when using NICODERM CQ 7 MG/24HR PT24 (NICOTINE) apply one patch once daily for 2 weeks, start 01/03/2011, do not smoke when using Prescriptions: NICODERM CQ 7 MG/24HR PT24 (NICOTINE) apply one patch once daily for 2 weeks, start 01/03/2011, do not smoke when using  #14 x 0   Entered by:   Adella Hare LPN   Authorized by:   Syliva Overman MD   Signed by:   Adella Hare LPN on 16/09/9603   Method used:   Electronically to        [facility] [address] [phone]* (retail)       [address] [place], [place]  [address]       Ph: [phone]       Fax: [phone]   RxID:   [id number] NICODERM CQ 14 MG/24HR PT24 (NICOTINE) apply one patch once daily  for 2 weeks, start 12/20/2010, do not smoke when using  #14 x 0   Entered by:   Adella Hare LPN   Authorized by:   Syliva Overman MD   Signed by:   Adella Hare LPN on 53/66/4403   Method used:   Electronically to        [facility] [phone]* (retail)       [address] [place], [place]  [address]       Ph: [phone]       Fax: [phone]   RxID:   [id number] NICODERM CQ 21 MG/24HR PT24 (NICOTINE) apply one patch once daily for 2 weeks, do not smoke while using, start 12/19 2011  #14 x 0   Entered by:   Adella Hare LPN   Authorized by:   Syliva Overman MD   Signed by:   Adella Hare LPN on 25/42/7062   Method used:   Electronically to        [facility] [address] [phone]* (retail)       [address]       [place], [place]  [address]  Ph: [phone]       Fax: [phone]   RxID:   [id number] NICODERM CQ 7 MG/24HR PT24 (NICOTINE) apply one patch once daily for 2 weeks, start 01/03/2011, do not smoke when using  #14 x 0   Entered and Authorized by:   Syliva Overman MD   Signed by:   Syliva Overman MD on 12/03/2010   Method used:   Historical   RxID:   [id number] NICODERM CQ 14 MG/24HR PT24 (NICOTINE) apply one patch once daily for 2 weeks, start 12/20/2010, do not smoke when using  #14 x 0   Entered and Authorized by:   Syliva Overman MD   Signed by:   Syliva Overman MD on 12/03/2010   Method used:   Electronically to        [facility] [address] [phone]* (retail)       [address] [place], [place]  [address]       Ph: [phone]       Fax: [phone]   RxID:   [id number] NICODERM CQ 21 MG/24HR PT24 (NICOTINE) apply one patch once daily for 2 weeks, do not smoke while using, start 12/19 2011  #14 x 0   Entered and Authorized by:   Syliva Overman MD   Signed by:   Syliva Overman MD on 12/03/2010   Method used:   Electronically to        [facility] [address] [phone]* (retail)       [address] [place]  [address]       Ph: [phone]       Fax: [phone]   RxID:   [id number]  rx sent, patient aware

## 2011-01-20 NOTE — Assessment & Plan Note (Signed)
Summary: follup from hospital   Vital Signs:  Patient profile:   55 year old female Menstrual status:  postmenopausal Height:      71 inches Weight:      245 pounds BMI:     34.29 O2 Sat:      97 % on Room air Pulse rate:   99 / minute Pulse rhythm:   regular Resp:     16 per minute BP sitting:   140 / 90  (left arm)  Vitals Entered By: Adella Hare LPN (November 23, 2010 9:37 AM)  Nutrition Counseling: Patient's BMI is greater than 25 and therefore counseled on weight management options.  O2 Flow:  Room air CC: hospital follow up Is Patient Diabetic? No Comments did not bring meds but states no changes, list correct   Primary Care Provider:  Syliva Overman MD  CC:  hospital follow up.  History of Present Illness: this past Saturday, 4 days ago pt had what appeared to be indigestion on awaking, took omeprazole and tums, not much relief, however started feeling dizzy. She ate a soft taco , and experienced jaw pain, following this while at [employer] developed left neck, shoulder  and upper extremity pain, currently exp left presssure, reports a 5, same as on Sat. uses chronic pain med, opana . she states this has been going on for 1 month, has had card referral in the past did not go states she has no money. she was recently reduced in the xanax dose by her psych, and she really feels this is the problem  Allergies (verified): No Known Drug Allergies  Review of Systems      See HPI General:  Complains of fatigue. Eyes:  Denies discharge and eye pain. ENT:  Denies hoarseness, nasal congestion, and sinus pressure. CV:  Complains of chest pain or discomfort, fatigue, and lightheadness; denies swelling of feet. Resp:  Denies cough and sputum productive. GI:  Denies abdominal pain, constipation, diarrhea, nausea, and vomiting. GU:  Denies dysuria and urinary frequency. MS:  Complains of low back pain and mid back pain. Psych:  Complains of anxiety, depression, and mental  problems; denies sense of great danger, suicidal thoughts/plans, and thoughts of violence; symptoms of anxiety worse and uncontrolled since xanax discontinued per pt, will send a note to cars. Endo:  Denies cold intolerance, excessive hunger, excessive thirst, and excessive urination. Heme:  Denies abnormal bruising and bleeding. Allergy:  Denies hives or rash.  Physical Exam  General:  Well-developed,well-nourished,in no acute distress; alert,appropriate and cooperative throughout examination HEENT: No facial asymmetry,  EOMI, No sinus tenderness, TM's Clear, oropharynx  pink and moist.   Chest: Clear to auscultation bilaterally.  CVS: S1, S2, No murmurs, No S3.   Abd: Soft, Nontender.  MS: Adequate ROM spine, hips, shoulders and knees.  Ext: No edema.   CNS: CN 2-12 intact, power tone and sensation normal throughout.   Skin: Intact, no visible lesions or rashes.  Psych: Good eye contact, flat affect.  Memory decreased, both anxious and depressed appearing.    Impression & Recommendations:  Problem # 1:  CHEST PAIN UNSPECIFIED (ICD-786.50) Assessment Deteriorated  Orders: Cardiology Referral (Cardiology), of note recent cardiac eval in the eD was negativeper pt report, currently denies chest pain so no eKG done at [facility]  Problem # 2:  ANXIETY DEPRESSION (ICD-300.4) Assessment: Deteriorated importance of re-establishing with therapist stressed, pt has multiple psychosocial stressors and historically next to no support  Problem # 3:  TOBACCO USER (  ICD-305.1) Assessment: Unchanged  Encouraged smoking cessation and discussed different methods for smoking cessation.   Problem # 4:  HYPERTENSION (ICD-401.9) Assessment: Deteriorated  Her updated medication list for this problem includes:    Lisinopril 20 Mg Tabs (Lisinopril) ..... One daly Patient advised to follow low sodium diet rich in fruit and vegetables, and to commit to at least 30 minutes 5 days per week of regular exercise  , to improve blood presure control.   BP today: 140/90 Prior BP: 140/80 (11/15/2010)  Prior 10 Yr Risk Heart Disease: 13 % (06/09/2009)  Labs Reviewed: K+: 4.0 (02/16/2010) Creat: : 0.77 (02/16/2010)   Chol: 195 (02/16/2010)   HDL: 51 (02/16/2010)   LDL: 123 (02/16/2010)   TG: 105 (02/16/2010)  Problem # 5:  HYPERLIPIDEMIA (ICD-272.4) Assessment: Comment Only  Her updated medication list for this problem includes:    Pravastatin Sodium 40 Mg Tabs (Pravastatin sodium) .Marland Kitchen... Take 1 tab by mouth at bedtime  Labs Reviewed: SGOT: 16 (02/16/2010)   SGPT: 9 (02/16/2010) Low fat dietdiscussed and encouraged fasting labs past due , will order Lipid Goals: Chol Goal: 200 (04/14/2009)   HDL Goal: 40 (04/14/2009)   LDL Goal: 130 (04/14/2009)   TG Goal: 150 (04/14/2009)  Prior 10 Yr Risk Heart Disease: 13 % (06/09/2009)   HDL:51 (02/16/2010), 40 (05/25/2009)  LDL:123 (02/16/2010), 126 (05/25/2009)  Chol:195 (02/16/2010), 185 (05/25/2009)  Trig:105 (02/16/2010), 96 (05/25/2009)  Complete Medication List: 1)  Cymbalta 60 Mg Cpep (Duloxetine hcl) .... One tab by mouth once daily 2)  Alprazolam 0.5 Mg Tabs (Alprazolam) .... Take 1-2 tabs once daily 3)  Prilosec 20 Mg Cpdr (Omeprazole) .... Two times a day 4)  Lisinopril 20 Mg Tabs (Lisinopril) .... One daly 5)  Oxycodone Hcl 15 Mg Tabs (Oxycodone hcl) .... One every 6 hours as needed 6)  Opana Er 20 Mg Xr12h-tab (Oxymorphone hcl) .... Two times a day 7)  Proair Hfa 108 (90 Base) Mcg/act Aers (Albuterol sulfate) .... 2 puffs every 6 hours as needed 8)  Miralax Powd (Polyethylene glycol 3350) .Marland KitchenMarland KitchenMarland Kitchen 17 g in 8 oz of h20 every other day 9)  Doxepin Hcl 150 Mg Caps (Doxepin hcl) .... Two caps by mouth at bedtime 10)  Voltaren 1 % Gel (Diclofenac sodium) .... Apply twice daily to affected area(s) as needed 11)  Clotrimazole-betamethasone 1-0.05 % Crea (Clotrimazole-betamethasone) .... Apply twice daily to rash for 1 week, then as needed 12)  Spiriva  Handihaler 18 Mcg Caps (Tiotropium bromide monohydrate) .... One inhalation once daily 13)  Pravastatin Sodium 40 Mg Tabs (Pravastatin sodium) .... Take 1 tab by mouth at bedtime  Patient Instructions: 1)  f/U as before 2)  You really do need to resume therapy and also you need to get appt with cardiology 3)  no med changes at this time   Orders Added: 1)  Est. Patient Level IV [65784] 2)  Cardiology Referral [Cardiology]

## 2011-01-21 NOTE — Medication Information (Signed)
Summary: Tax adviser   Imported By: Diana Eves 12/28/2009 08:57:39  _____________________________________________________________________  External Attachment:    Type:   Image     Comment:   External Document  Appended Document: RX Folder Please let pharmacy know we are not prescribing this med for her.    Appended Document: RX Folder Spoke with [place] @ pharmacy and informed him. Also, spoke with pt, and she says she has appt with new PCP on 01/26/2010 and she has enough until then.

## 2011-01-27 ENCOUNTER — Encounter (HOSPITAL_COMMUNITY): Payer: Self-pay | Admitting: Psychiatry

## 2011-02-09 ENCOUNTER — Telehealth: Payer: Self-pay | Admitting: Family Medicine

## 2011-02-11 ENCOUNTER — Telehealth (INDEPENDENT_AMBULATORY_CARE_PROVIDER_SITE_OTHER): Payer: Self-pay | Admitting: *Deleted

## 2011-02-14 ENCOUNTER — Other Ambulatory Visit: Payer: Self-pay | Admitting: Anesthesiology

## 2011-02-14 DIAGNOSIS — M5137 Other intervertebral disc degeneration, lumbosacral region: Secondary | ICD-10-CM

## 2011-02-14 DIAGNOSIS — M545 Low back pain: Secondary | ICD-10-CM

## 2011-02-14 DIAGNOSIS — S335XXA Sprain of ligaments of lumbar spine, initial encounter: Secondary | ICD-10-CM

## 2011-02-14 DIAGNOSIS — M533 Sacrococcygeal disorders, not elsewhere classified: Secondary | ICD-10-CM

## 2011-02-15 ENCOUNTER — Encounter (HOSPITAL_COMMUNITY): Payer: Medicare PPO | Admitting: Psychiatry

## 2011-02-15 NOTE — Progress Notes (Signed)
Summary: refill  Phone Note Call from Patient   Summary of Call: needs a voltaren gel. call into [facility]. [phone] Initial call taken by: Rudene Anda,  February 09, 2011 2:16 PM    Prescriptions: VOLTAREN 1 % GEL (DICLOFENAC SODIUM) apply twice daily to affected area(s) as needed  #29mnth supp x 1   Entered by:   Everitt Amber LPN   Authorized by:   Syliva Overman MD   Signed by:   Everitt Amber LPN on 67/89/3810   Method used:   Faxed to ...       [facility] (mail-order)             , [place]         Ph: [phone]       Fax: [phone]   RxID:   [id number]

## 2011-02-16 ENCOUNTER — Ambulatory Visit (HOSPITAL_COMMUNITY)
Admission: RE | Admit: 2011-02-16 | Discharge: 2011-02-16 | Disposition: A | Discharge status: Home / Self Care | Payer: Medicare PPO | Source: Ambulatory Visit | Attending: Anesthesiology | Admitting: Anesthesiology

## 2011-02-16 DIAGNOSIS — M47817 Spondylosis without myelopathy or radiculopathy, lumbosacral region: Secondary | ICD-10-CM | POA: Insufficient documentation

## 2011-02-16 DIAGNOSIS — M51379 Other intervertebral disc degeneration, lumbosacral region without mention of lumbar back pain or lower extremity pain: Secondary | ICD-10-CM

## 2011-02-16 DIAGNOSIS — M5137 Other intervertebral disc degeneration, lumbosacral region: Secondary | ICD-10-CM

## 2011-02-16 DIAGNOSIS — M5126 Other intervertebral disc displacement, lumbar region: Secondary | ICD-10-CM | POA: Insufficient documentation

## 2011-02-16 DIAGNOSIS — M533 Sacrococcygeal disorders, not elsewhere classified: Secondary | ICD-10-CM

## 2011-02-16 DIAGNOSIS — M545 Low back pain, unspecified: Secondary | ICD-10-CM

## 2011-02-16 DIAGNOSIS — S335XXA Sprain of ligaments of lumbar spine, initial encounter: Secondary | ICD-10-CM

## 2011-02-18 ENCOUNTER — Telehealth: Payer: Self-pay | Admitting: Family Medicine

## 2011-02-24 NOTE — Progress Notes (Signed)
Summary: referral  Phone Note Call from Patient   Summary of Call: pt needs a referral to [facility]. Said dr. Lolly Mustache doesn't hear what she is saying. [phone] Initial call taken by: Rudene Anda,  February 11, 2011 3:13 PM  Follow-up for Phone Call        pls refer Follow-up by: Syliva Overman MD,  February 11, 2011 3:15 PM  Additional Follow-up for Phone Call Additional follow up Details #1::        I referred [facility] to [facility]. but she has to call and have consultation and then they will schedule her. Patient was called Additional Follow-up by: Rudene Anda,  February 14, 2011 3:04 PM

## 2011-02-28 LAB — BASIC METABOLIC PANEL
BUN: 9 mg/dL (ref 6–23)
Calcium: 9.5 mg/dL (ref 8.4–10.5)
Creatinine, Ser: 1.28 mg/dL — ABNORMAL HIGH (ref 0.4–1.2)
GFR calc non Af Amer: 44 mL/min — ABNORMAL LOW (ref >60)
Glucose, Bld: 109 mg/dL — ABNORMAL HIGH (ref 70–99)

## 2011-02-28 LAB — DIFFERENTIAL
Basophils Absolute: 0.1 10*3/uL (ref 0.0–0.1)
Basophils Relative: 1 % (ref 0–1)
Monocytes Absolute: 0.7 10*3/uL (ref 0.1–1.0)
Neutro Abs: 4.6 10*3/uL (ref 1.7–7.7)
Neutrophils Relative %: 55 % (ref 43–77)

## 2011-02-28 LAB — POCT CARDIAC MARKERS
CKMB, poc: <1 ng/mL — ABNORMAL LOW (ref 1.0–8.0)
Troponin i, poc: <0.05 ng/mL (ref 0.00–0.09)
Troponin i, poc: <0.05 ng/mL (ref 0.00–0.09)

## 2011-02-28 LAB — CBC
MCHC: 34.6 g/dL (ref 30.0–36.0)
RDW: 13.9 % (ref 11.5–15.5)

## 2011-03-01 ENCOUNTER — Encounter (HOSPITAL_COMMUNITY): Payer: Medicare PPO | Admitting: Psychiatry

## 2011-03-01 NOTE — Progress Notes (Signed)
Summary: v. gel  Phone Note Call from Patient   Summary of Call: needs a refill  volteren gel  called into [facility]  call when done  [phone] Initial call taken by: Lind Guest,  February 18, 2011 11:50 AM  Follow-up for Phone Call        plsrefill x 3 , let her know Follow-up by: Syliva Overman MD,  February 18, 2011 1:22 PM  Additional Follow-up for Phone Call Additional follow up Details #1::        This patient just called in Feb 22 and asked it be sent to [facility] and it was sent in. Do you still want me to send it to [facility]? Tried to call patient, no answer Additional Follow-up by: Everitt Amber LPN,  February 18, 2011 1:29 PM    Additional Follow-up for Phone Call Additional follow up Details #2::    explain to her some already sentto mail order 1 week ago, should receive, do not send locally pls , thanks Follow-up by: Syliva Overman MD,  February 18, 2011 1:32 PM  Additional Follow-up for Phone Call Additional follow up Details #3:: Details for Additional Follow-up Action Taken: called patient no answer Additional Follow-up by: Everitt Amber LPN,  February 21, 2011 2:34 PM

## 2011-03-10 ENCOUNTER — Encounter (INDEPENDENT_AMBULATORY_CARE_PROVIDER_SITE_OTHER): Payer: Medicare PPO | Admitting: Psychiatry

## 2011-03-10 DIAGNOSIS — F3189 Other bipolar disorder: Secondary | ICD-10-CM

## 2011-03-24 LAB — BASIC METABOLIC PANEL
BUN: 4 mg/dL — ABNORMAL LOW (ref 6–23)
Chloride: 101 mEq/L (ref 96–112)
GFR calc Af Amer: >60 mL/min (ref >60)
GFR calc non Af Amer: >60 mL/min (ref >60)
Potassium: 3.9 mEq/L (ref 3.5–5.1)
Sodium: 137 mEq/L (ref 135–145)

## 2011-03-24 LAB — HEMOGLOBIN AND HEMATOCRIT, BLOOD
HCT: 47.7 % — ABNORMAL HIGH (ref 36.0–46.0)
Hemoglobin: 15.9 g/dL — ABNORMAL HIGH (ref 12.0–15.0)

## 2011-03-31 ENCOUNTER — Encounter (HOSPITAL_COMMUNITY): Payer: Medicare PPO | Admitting: Psychiatry

## 2011-04-01 ENCOUNTER — Ambulatory Visit: Payer: Medicare PPO | Admitting: Family Medicine

## 2011-04-06 ENCOUNTER — Encounter: Payer: Self-pay | Admitting: Family Medicine

## 2011-04-07 ENCOUNTER — Encounter: Payer: Self-pay | Admitting: Family Medicine

## 2011-04-07 ENCOUNTER — Ambulatory Visit (INDEPENDENT_AMBULATORY_CARE_PROVIDER_SITE_OTHER): Payer: Medicare PPO | Admitting: Family Medicine

## 2011-04-07 VITALS — BP 130/84 | HR 91 | Resp 16 | Ht 71.0 in | Wt 239.4 lb

## 2011-04-07 DIAGNOSIS — I10 Essential (primary) hypertension: Secondary | ICD-10-CM

## 2011-04-07 DIAGNOSIS — R5381 Other malaise: Secondary | ICD-10-CM

## 2011-04-07 DIAGNOSIS — R5383 Other fatigue: Secondary | ICD-10-CM

## 2011-04-07 DIAGNOSIS — F329 Major depressive disorder, single episode, unspecified: Secondary | ICD-10-CM

## 2011-04-07 DIAGNOSIS — M549 Dorsalgia, unspecified: Secondary | ICD-10-CM

## 2011-04-07 DIAGNOSIS — N760 Acute vaginitis: Secondary | ICD-10-CM

## 2011-04-07 DIAGNOSIS — G8929 Other chronic pain: Secondary | ICD-10-CM

## 2011-04-07 DIAGNOSIS — E785 Hyperlipidemia, unspecified: Secondary | ICD-10-CM

## 2011-04-07 DIAGNOSIS — M5090 Cervical disc disorder, unspecified, unspecified cervical region: Secondary | ICD-10-CM

## 2011-04-07 MED ORDER — KETOROLAC TROMETHAMINE 30 MG/ML IJ SOLN
60.0000 mg | Freq: Once | INTRAMUSCULAR | Status: AC
  Administered 2011-04-07: 60 mg via INTRAMUSCULAR

## 2011-04-07 MED ORDER — PRAVASTATIN SODIUM 40 MG PO TABS: 40.0000 mg | ORAL_TABLET | Freq: Every day | ORAL | Status: DC

## 2011-04-07 MED ORDER — LISINOPRIL 20 MG PO TABS: 20.0000 mg | ORAL_TABLET | Freq: Every day | ORAL | Status: DC

## 2011-04-07 NOTE — Patient Instructions (Addendum)
F/u in 6 months.   Please think about quitting smoking.  This is very important for your health.  Consider setting a quit date, then cutting back or switching brands to prepare to stop.  Also think of the money you will save every day by not smoking.  Quick Tips to Quit Smoking: Fix a date i.e. keep a date in mind from when you would not touch a tobacco product to smoke  Keep yourself busy and block your mind with work loads or reading books or watching movies in malls where smoking is not allowed  Vanish off the things which reminds you about smoking for example match box, or your favorite lighter, or the pipe you used for smoking, or your favorite jeans and shirt with which you used to enjoy smoking, or the club where you used to do smoking  Try to avoid certain people places and incidences where and with whom smoking is a common factor to add on  Praise yourself with some token gifts from the money you saved by stopping smoking  Anti Smoking teams are there to help you. Join their programs  Anti-smoking Gums are there in many medical shops. Try them to quit smoking   Side-effects of Smoking: Disease caused by smoking cigarettes are emphysema, bronchitis, heart failures  Premature death  Cancer is the major side effect of smoking  Heart attacks and strokes are the quick effects of smoking causing sudden death  Some smokers lives end up with limbs amputated  Breathing problem or fast breathing is another side effect of smoking  Due to more intakes of smokes, carbon mono-oxide goes into your brain and other muscles of the body which leads to swelling of the veins and blockage to the air passage to lungs  Carbon monoxide blocks blood vessels which leads to blockage in the flow of blood to different major body organs like heart lungs and thus leads to attacks and deaths  During pregnancy smoking is very harmful and leads to premature birth of the infant, spontaneous abortions, low weight of the  infant during birth  Fat depositions to narrow and blocked blood vessels causing heart attacks  In many cases cigarette smoking caused infertility in men    Specimens will be sent for testing per your request.  You will get toradol injection today for pain.  You will be referred to a local pain clinic  Fasting labs are past due and should be done as soon as possible

## 2011-04-07 NOTE — Progress Notes (Signed)
  Subjective:    Patient ID: Penny Cox, female    DOB: Jan 25, 1956, 55 y.o.   MRN: [id number]  HPI Pt reports she is in pain has disc disease and has been discharged from the clinic she was going to [facility] for reportedly 3 yrs approx. Needs new provider for this. C/o malodoros vaginal d/c for months wants it checked. Smokes 7 ciggs per day, trying to quit. Denies uncontrolled depression, sees mental health regularly, appears less stressed than previously.   Review of Systems Denies recent fever or chills. Denies sinus pressure, nasal congestion, ear pain or sore throat. Denies chest congestion, productive cough or wheezing. Denies chest pains, palpitations, paroxysmal nocturnal dyspnea, orthopnea and leg swelling Denies abdominal pain, nausea, vomiting,diarrhea or constipation.  Denies rectal bleeding or change in bowel movement. Denies dysuria, frequency, hesitancy or incontinence.  Denies headaches, seizure, numbness, or tingling. Denies uncontrolled  depression, anxiety or insomnia. Denies skin break down or rash.        Objective:   Physical Exam    Patient alert and oriented and in no Cardiopulmonary distress.  HEENT: No facial asymmetry, EOMI, no sinus tenderness, TM's clear, Oropharynx pink and moist.  Neck supple no adenopathy.  Chest: Clear to auscultation bilaterally.  CVS: S1, S2 no murmurs, no S3.  ABD: Soft non tender. Bowel sounds normal.  Ext: No edema  MS: Decreased ROM spine,adequate in  shoulders, hips and knees.  Skin: Intact, no ulcerations or rash noted.  Psych: Good eye contact, flat  affect. Memory intact not anxious, but depressed appearing.  CNS: CN 2-12 intact, power, tone and sensation normal throughout.  Pelvic: specimens sent for wet prep and culture, douching discouraged, and condom use encouraged to reduce risk of STD   Assessment & Plan:  1.Back pain: deteriorated. Toradol administered, referral to local pain  clinic 2.nicotine use: improved, encouraged to quit 3.Hypertension:Controlled, no changes in medication. 4. Vulvovaginitis: specimens sent 5.Obesity: unchanged  6.Hyperlipidemia: Hyperlipidemia:Low fat diet discussed and encouraged. 7.Continue current meds.

## 2011-04-08 ENCOUNTER — Encounter: Payer: Self-pay | Admitting: Family Medicine

## 2011-04-09 LAB — GC/CHLAMYDIA PROBE AMP, GENITAL
Chlamydia, DNA Probe: NEGATIVE
GC Probe Amp, Genital: NEGATIVE

## 2011-04-11 ENCOUNTER — Telehealth: Payer: Self-pay | Admitting: Family Medicine

## 2011-04-11 NOTE — Telephone Encounter (Signed)
Can patient have referral?

## 2011-04-11 NOTE — Telephone Encounter (Signed)
A referral was already entered just send her to the clinic ashe wants to go to pls

## 2011-04-12 ENCOUNTER — Telehealth: Payer: Self-pay | Admitting: *Deleted

## 2011-04-12 NOTE — Telephone Encounter (Signed)
Apart from a call to customer service asa complaint, I suggest you notify pt to resubmit a specimen, she can self collect

## 2011-04-13 NOTE — Telephone Encounter (Signed)
Called patient, left messsage

## 2011-04-13 NOTE — Telephone Encounter (Signed)
Patient states she is not symptomatic and if she starts having a problem she will come by to recollect

## 2011-04-21 ENCOUNTER — Encounter (INDEPENDENT_AMBULATORY_CARE_PROVIDER_SITE_OTHER): Payer: Medicare PPO | Admitting: Psychiatry

## 2011-04-21 DIAGNOSIS — F3189 Other bipolar disorder: Secondary | ICD-10-CM

## 2011-04-25 ENCOUNTER — Telehealth: Payer: Self-pay | Admitting: Family Medicine

## 2011-04-25 DIAGNOSIS — G8929 Other chronic pain: Secondary | ICD-10-CM

## 2011-04-25 NOTE — Telephone Encounter (Signed)
pls refer pt to pain management of her choice for evaluation and tratment of chronic pain

## 2011-05-03 NOTE — Assessment & Plan Note (Signed)
REPORT TITLE:  FOLLOWUP VISIT   The patient follows up today.  She was seen by me in initial  consultation on 09/25/2007.  A 55 year old female with diagnosis of  meralgia paresthetica.  She also has some lumbosacral spine degenerative  spondylosis.  The main pain is lateral thigh.  She had a suicide attempt  with Lyrica post Doxepin.  Now followed by outpatient psychiatry.  She  denies any suicidal ideation.   Her average pain is 8/10, stabbing, tingling, constant burning.  Walks  10 minutes at a time.  She drives.  Has difficulty with household duties  and shopping.  Notes her disability as being from February 2008.   REVIEW OF SYSTEMS:  As noted, tingling, trouble walking, spasms,  dizziness, confusion, depression, coughing and constipation.   Urine drug screen demonstrated oxycodone and oxymorphone, as well as  cocaine.  She is non-narcotic management and the results of this have  been given to the patient and hence her referring physician.   EXAMINATION:  She has no swelling in the lower extremities.  Normal  gait.  She has no tenderness over ASIS area.  No masses in that area.  Good femoral pulse.   IMPRESSION:  Meralgia paresthetica.  While this can be painful it  usually does not result in pain which triggers suicide attempt.  I  discussed this with the patient that she has a combination of some  chronic neurogenic pain plus depression, as well as substance abuse.  Overall the plan is to avoid medications that she can abuse or overdose  on.  She has been doing okay on her Neurontin.  She is on 100 mg q.i.d.  but will increase it slightly to 300 b.i.d. and will do a nerve block as  well.  May benefit from a TENS unit.  Will discuss that next visit.      Erick Colace, M.D.  Electronically Signed     AEK/MedQ  D:  10/04/2007 17:20:38  T:  10/05/2007 16:03:18  Job #:  [id number]

## 2011-05-03 NOTE — Discharge Summary (Signed)
Penny Cox, Penny Cox             ACCOUNT NO.:  [id number]   MEDICAL RECORD NO.:  [id number]          PATIENT TYPE:  IPS   LOCATION:  0304                          FACILITY:  [facility]   PHYSICIAN:  Jasmine Pang, M.D. DATE OF BIRTH:  Jun 20, 1956   DATE OF ADMISSION:  09/12/2007  DATE OF DISCHARGE:  09/17/2007                               DISCHARGE SUMMARY   IDENTIFICATION:  A 55 year old single white female who was admitted on a  voluntary basis on September 12, 2007.   HISTORY OF PRESENT ILLNESS:  The patient has a history of depression.  She took 2 handfuls of Lexapro and doxepin wanting to die approximately  3 days ago.  She was hospitalized here for evaluation.  She left a  suicide note.  Appetite has been okay.  She felt no support from her  children.  She has been having significant financial stressors.  She  reports being depressed since the death of her husband in 13-Sep-1997 (this is the anniversary).  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  Her trailer is being repossessed, and she reportedly  feels her 2 daughters do not care about her.  She states she decided to  end it all.  In addition to the doxepin and Lexapro, she also overdosed  on Lyrica.  Her daughter found her one hour later and called 911.  She  denies any alcohol or drug use.  This is the first [facility] admission for the  patient.  She sees Dr. Omelia Blackwater, Psychiatrist, in [place].   PAST MEDICAL HISTORY:  She has been on Paxil in the past.  She denies  any alcohol or drug use.  Dr. Metta Clines is her primary care physician.  She  states she has a sleep disorder and back pain and neuropathy of her left  leg.  She had a seizure disorder diagnosed one year ago.   MEDICATIONS:  She is currently on doxepin, Lyrica, Xanax, and Depakote.   ALLERGIES:  She has no known drug allergies.   PHYSICAL FINDINGS:  Physical exam was done at  [facility].  She  received a pneumococcal vaccine.  Poison control was called.  There were  no acute physical or medical problems noted.   LABORATORY DATA:  EKG revealed a prolonged QT.  CBC was within normal  limits.  UDS was positive for benzodiazepine.  Alcohol was less than 5.  Valproic acid was 14.   HOSPITAL COURSE:  Upon admission, the patient was restarted on doxepin  250 mg p.o. q.h.s. and Depakote 1000 mg p.o. b.i.d.  She was also  started on Lyrica 150 p.o. b.i.d. and Xanax 1 mg p.o. b.i.d. p.r.n.  She  was started on ibuprofen 800 mg p.o. t.i.d. and amitriptyline 25 mg p.o.  2 tablets at bedtime.  The patient tolerated these medications well with  no significant side effects.  During the hospitalization and sessions  with me the patient was very appropriate, friendly, and cooperative.  She wanted  to go home because her health is being repossessed.  She was  able to participate appropriately in unit therapeutic groups and  activities.  Sleep was fairly good.  She continued to be dysphoric and  anxious at first.  Her daughters visited, but this did not go well.  She  stated her youngest daughter was angry about her suicide attempt.  As  hospitalization progressed, the patient again denies suicidal or  homicidal ideation.  She still expressed anger towards her family, who  she felt did not understand her illness.  She was irritable at times  with poor insight.  On September 17, 2007, mental status had improved  markedly from admission status.  The patient was friendly and  cooperative with good eye contact.  Speech was normal, rate and flow.  Psychomotor activity was within normal limits.  Mood was euthymic.  Affect, wide range.  There was no suicidal or homicidal ideation or  thoughts of self-injurious behavior.  No auditory visual or  hallucinations.  No paranoia or delusions.  Thoughts were logical and  goal directed.  Thought content, no predominant theme.   Cognitive was  grossly back to baseline.  It was felt the patient was safe to be  discharged today.   DISCHARGE DIAGNOSES:  AXIS I:  Major depressive disorder, recurrent and  severe.  AXIS II:  None.  AXIS III:  Back pain and seizure disorder.  AXIS IV:  Severe (problems with primary support group, health problem,  economic problem, other psychosocial problem, medical problems, and  burden of psychiatric illness).  AXIS V:  Global assessment of functioning was 50 upon discharge.  Global  assessment of functioning was 30 upon admission.  Global assessment of  functioning was 65 highest past year.   DISCHARGE PLAN:  There were no specific activity level or dietary  restrictions.   POST-HOSPITAL CARE PLANS:  The patient will see Dr. Omelia Blackwater on September 25, 2007, at 10 o'clock a.m.   DISCHARGE MEDICATIONS:  1. Doxepin 250 mg at bedtime.  2. Depakote ER 1000 mg twice a day.  3. Lyrica 150 mg twice a day.  4. Xanax 1 mg twice a day as needed for anxiety.  5. Motrin as advised by her family doctor.   She was to follow up with her family doctor for health issues.      Jasmine Pang, M.D.  Electronically Signed     BHS/MEDQ  D:  10/15/2007  T:  10/16/2007  Job:  [id number]

## 2011-05-03 NOTE — Group Therapy Note (Signed)
REASON FOR CONSULTATION:  For evaluation of low back and proximal lower  extremity pain particularly left lateral side.   HISTORY OF PRESENT ILLNESS:  A 55 year old female who noted onset of  left lateral thigh burning pain which she states is severe starting  about eight months ago.  She had further workup including EMG and NCV,  which were negative in terms of peroneal and posterior tibial.  A  lateral femoral cutaneous was not performed.  She had EEG which was  negative.  This was done for evaluation of syncopal event referred by  Dr. Hilda Lias from orthopedics.  Because of her back pain she had MRI of  the lumbar spine dated September 21, 2006 demonstrating moderate facet  arthropathy, hypertrophy, mild to moderate right foraminal stenosis,  minimal left foraminal stenosis, L5-S1 small annual tear extending to  the right S1 neural groove.  Mild to moderate right femoral narrowing of  L4-L5.   She was diagnosed with meralgia paraesthetica.  Lyrica was tried and not  particularly helpful.  Has not tried any injections.  She did have some  physical therapy, however.  Average pain is 6-8/10.  It interferes with  activities at 7-10 level.  Sleep is poor.  She can walk 5 minutes at a  time.  She needs help with transfers.  She needs help with bathing and  household duties.   REVIEW OF SYSTEMS:  Positive for numbness in the lateral side.  Depression, suicidal thoughts.   RECENT MEDICAL HISTORY:  She had been hospitalized as an inpatient in  the end of September for an overdose of Lyrica plus doxepin.  She was  treated initially at [facility] and transferred to [facility].  Treated and released and is now seeing outpatient  psychiatry.   PRIMARY CARE PHYSICIAN:  Dr. Sudie Bailey in [place].   SOCIAL HISTORY:  Widowed.  Smokes half pack a day.   PHYSICAL EXAMINATION:  VITAL SIGNS:  Blood pressure 119/61, pulse 73,  respiratory rate 18, O2 sat 97% on room air.  GENERAL:  In no acute distress.  NEUROLOGICAL:  Alert and oriented x3.  Gait is normal.  In no acute  distress.  Mood and affect appropriate.  BACK:  No tenderness to palpation.  NECK:  She has good neck range motion.  EXTREMITIES:  Upper extremity strength is normal.  Lower extremity  strength is normal.  She has pain with light palpation of her lateral  thigh on the left side only.  No pain on the medial side.  Deep tendon  reflexes are normal strength.  Gait is without evidence of toe dragging  or instability.   IMPRESSION:  Meralgia paresthetica with lateral thigh burning pain.  Less likely would be lumbar radiculopathy.  Particularly this would  involve the upper lumbar roots.  In terms of pain management would stay  away from narcotic analgesics given the suicide attempt.  Also other  medicines that can result in overdose such as tricyclic antidepressants.  She is being seen by psychiatry, and they are managing her Xanax.  We  discussed treatment options and will start out with left lateral femoral  cutaneous nerve block under E-STIM guidance.  We also offered Lidocaine  patch, but she states she has tried this and she is too hypersensitive  when she tries to take it off to really use it.   Will also try low dose Neurontin 100 mg working up over one month's time  to q.i.d.  I will see her back for the nerve block.   Thank you very much for interesting consultation.      Erick Colace, M.D.  Electronically Signed     AEK/MedQ  D:  09/25/2007 16:32:17  T:  09/26/2007 10:17:08  Job #:  [id number]   cc:   Estanislado Pandy, MD  Fax: [phone]   Mila Homer. Sudie Bailey, M.D.  Fax: [phone]   Ferne Coe, M.D.

## 2011-05-03 NOTE — H&P (Signed)
NAMEGRIER, CZERWINSKI             ACCOUNT NO.:  [id number]   MEDICAL RECORD NO.:  [id number]          PATIENT TYPE:  INP   LOCATION:  IC04                          FACILITY:  [facility]   PHYSICIAN:  Mila Homer. Sudie Bailey, M.D.DATE OF BIRTH:  01-25-1956   DATE OF ADMISSION:  09/10/2007  DATE OF DISCHARGE:  LH                              HISTORY & PHYSICAL   This 55 year old was admitted with an overdose last evening.  She said  she had problems with pain control and other issues, including two  daughters who hated her, her trailer is being repossessed, and therefore  took an overdose of pills.  She said she took a handful of Lyrica and  doxepin.   She is due to be seen at a pain control facility early October.  She has  had chronic issues with poorly definable pain.   At the time she came to emergency room, she was a poor historian and  somewhat lethargic, but she was breathing without respiratory distress,  she was cooperative, she was able to ambulate with assistance, and her  aeration seemed to be normal.  She was handling secretions well, but she  did have a decreased level of consciousness.   PAST MEDICAL HISTORY:  1. Hepatitis C.  2. Chronic neuropathy.  3. Chronic back pain.   SOCIAL HISTORY:  She has had a problem with probable tobacco use  disorder.  She denied the use of alcohol or drugs.   HOME MEDICATIONS:  1. Alprazolam 1 mg b.i.d.  2. Lyrica 75 mg 2 tablets b.i.d.  3. Depakote unknown dose 2 tablets b.i.d.  4. Doxepin unknown dose q.h.s.   She denies ever having a overdose suicidal attempt or gesture in the  past.   PHYSICAL EXAMINATION:  VITAL SIGNS:  Admission blood pressure is 142/76,  pulse 83, respiratory rate 18, temperature 97.5.  Her O2 saturation was  93% on 2 liters of O2 by nasal prongs.  GENERAL:  At the time of my exam, she was oriented and alert.  Her  speech was normal.  There was no slurring of speech.  She is a good  historian.  Sentence  structure was intact.  HEENT:  Mucous membranes are moist and skin turgor is normal.  HEART:  Regular rhythm and rate of about 80  LUNGS:  Clear throughout, moving air well.  ABDOMEN:  Soft without hepatosplenomegaly or mass or tenderness.  EXTREMITIES:  There was no edema of the distal legs.   LABORATORY DATA:  Her admission white cell count was 7100, H&H  13.3/39.3.  She had a normal differential, normal platelet count.  CMP  was likewise normal, except for BUN slightly low at 4.  Alcohol level  was less than 5, valproic acid level was 20 (normal range 50-100).  Urine drug screen was positive for benzodiazepines but negative for  amphetamines, barbiturates, cocaine, opiates, and tetrahydrocannabinol.  Recheck valproic acid level was 14 and recheck BMET showed a bicarb of  33, glucose 115 the morning after the overdose.  UA was essentially  negative.   ADMISSION DIAGNOSES:  1. Drug overdose.  2. Suicidal attempt versus suicidal gesture.  3. Chronic pain.  4. Chronic narcotic use.  5. Anxiety.  6. Obesity.   Currently she is in the ICU.  She seems to have woken up.  We will put  her on a regular diet.  The ACT team will see her.   I did note of interest that urine drug screen was negative for opiates  despite the fact that she has had opiates prescribed on a regular basis  and apparently by her history was using these on a regular basis.      Mila Homer. Sudie Bailey, M.D.  Electronically Signed     SDK/MEDQ  D:  09/11/2007  T:  09/11/2007  Job:  [id number]

## 2011-05-03 NOTE — Assessment & Plan Note (Signed)
REPORT TITLE:  FOLLOW UP VISIT   This is a lateral femoral cutaneous nerve block under E Stim guidance.  Indication is neuralgia paresthetica unresponsive to medications.  She  is also not a good candidate for narcotic analgesics due to suicide  attempt a couple of months ago.   Informed consent was obtained after describing risks and benefits of the  procedure to the patient. The risks include bleeding, bruising,  infection.  She would like to proceed and has given written consent.  The patient was placed supine on the exam table.  Nursing assistance she  had EC Stim applied 1 cm lateral and 1 cm inferior to ASIS.  E Stim  identified lateral paresthesias in the thigh and continue 1 mL of 40 mg  per mL Depo-Medrol and 4 mL of 1% Lidocaine were injected.  The patient  tolerate the procedure well.  Post injection instructions given.  Return  in 2 or 3 weeks for followup.      Erick Colace, M.D.  Electronically Signed     AEK/MedQ  D:  10/04/2007 17:16:38  T:  10/05/2007 15:43:17  Job #:  [id number]

## 2011-05-03 NOTE — Discharge Summary (Signed)
[name], Penny Cox             ACCOUNT NO.:  [id number]   MEDICAL RECORD NO.:  [id number]          PATIENT TYPE:  INP   LOCATION:  IC04                          FACILITY:  [facility]   PHYSICIAN:  Mila Homer. Sudie Bailey, M.D.DATE OF BIRTH:  12/31/1955   DATE OF ADMISSION:  09/10/2007  DATE OF DISCHARGE:  09/24/2008LH                               DISCHARGE SUMMARY   HISTORY:  This 55 year old took an overdose of doxepin and Lyrica.  She  had a benign 3-day hospitalization extending from September 10, 2007  through September 12, 2007.  Vital signs remained stable.   Her lab tests are noted on the admission H&P in the note.  Urine drug  screen was positive for benzodiazepines, negative for opiates.  Tricyclic screen was also positive in the urine.   She was admitted to the ICU with diagnosis of drug overdose and suicidal  attempt.  She had an EKG and was put on a cardiac monitor.  She was  initially felt to have a prolonged Q-T on EKG.  Repeat just showed  borderline EKG.  Monitor seemed to be normal.  Rechecked EKG her second  day just showed nonspecific T-wave abnormality.   She was given IV 3 amps of sodium bicarb and a liter of D5W at 125 mL an  hour.  Foley catheter to gravity.  O2 at 2 L a minute by nasal cannula.  Her Depakote level was checked.   The second day, she was much more alert and, at that time, she was put  on a nicotine patch 20 mg daily.  She was also given Lovenox 40 mg subcu  daily prophylaxis.  She was put back on Depakote 500 mg 2 tablets b.i.d.  her second day.  Foley was [facility] later that day and she seemed to be back  to normal with her sensorium her third day.  When asked on that day why  she took the overdose of pills, she said she wanted to kill herself.  She also had a persistent pain in the left lateral thigh which she said  did not respond to prednisone, to Lyrica, or to actually narcotics in  the past.   FINAL DIAGNOSES:  1. Drug overdose.  2. Suicide  attempt.  3. Chronic pain.  4. Chronic narcotic use.  5. History of hepatitis C.  6. Question seizures versus pseudoseizures.  7. Anxiety.  8. Obesity.  9. Tobacco use disorder.   Currently, she is due to be seen by the ACT team and plans are for her  to transfer to the Behavior Health section at [facility] as soon as possible where she can have full evaluation by a  psychiatrist.  Meanwhile, will continue on her Depakote 1000 mg b.i.d., Lovenox 40 mg  subcu daily, Nicoderm 21-mg patch daily until she has been evaluated and  transfer is made.  [place] form filled out.  Pneumococcal vaccine  administered in the hospital.      Mila Homer. Sudie Bailey, M.D.  Electronically Signed     SDK/MEDQ  D:  09/12/2007  T:  09/13/2007  Job:  [id number] 

## 2011-05-03 NOTE — Assessment & Plan Note (Signed)
A 55 year old female with diagnosis of myalgia paresthetica.  She has  lumbosacral spine degenerative spondylosis as well, maintains in the  lateral thigh.  She has had a 2-side attempt with Lyrica and doxepin.  She is followed by outpatient psychiatry.  She denies any current  suicidal ideation.  She has had an injection of the lateral femoral  cutaneous nerve, which resulted in pain relief, which is ongoing,  although it is starting to wear off.  Her usual thigh pain has been  significantly diminished.  Continues to have low back pain.  Her pain  diagram indicates low back/buttock pain and some lateral thigh pain.  Average pain is about 6/10, currently 5/10.  Her sleep is good.  She can  walk 5 minutes at a time.  She climbs steps.  She drives.  She needs  some assistance with household duties.   REVIEW OF SYSTEMS:  Positive for confusion, depression.  She has had  suicidal thoughts, but no active plan, dizziness, constipation.   SOCIAL HISTORY:  Widowed.  Lives alone.   Blood pressure 136/80, pulse 78, respirations 18, O2 saturation 96% on  room air.  GENERAL:  In no acute distress.  Oriented x3.  Affect is alert.  Gait  normal.   She has no swelling in the lower extremities.  Normal gait.  She has  reduced sensation in the lateral thigh.  Her back has tenderness to  palpation in the bilateral lumbosacral area.   Her lower extremity strength is good.  Deep tendon reflexes are normal.   Review of past MRI dated September 21, 2006, moderate right foraminal  narrowing L4-5, annular tear right paracentral disk L5-S1, mainly on the  right as well.  She has L2-3 disk desiccation, facet hypertrophy.  No  significant stenosis at that level from 1 year ago.  She has bilateral  facet hypertrophy with ligamental thickening.  No significant stenosis  at L3-4 either.   She did have some left foraminal narrowing at L4-5.   IMPRESSION:  1. Lateral thigh pain consistent with neuralgia  paresthetica,      temporary relief with block.  We may get better relief with repeat      and then combine with a more proximal block at L2-3 one week later.  2. I will see her back for the above.  3. Continue Neurontin 300 b.i.d.  4. Referral for TENS unit.  She has had some relief with TENS in the      past with physical therapy.  I will have her go to physical therapy      twice for this.      Erick Colace, M.D.  Electronically Signed     AEK/MedQ  D:  10/19/2007 15:40:36  T:  10/20/2007 14:49:24  Job #:  [id number]   cc:   Dr. Elita Boone D. Sudie Bailey, M.D.  Fax: [phone]   Bevelyn Buckles. Nash Shearer, M.D.  Fax: [phone]

## 2011-05-03 NOTE — Procedures (Signed)
NAMEABRIELLA, FILKINS             ACCOUNT NO.:  [id number]   MEDICAL RECORD NO.:  [id number]          PATIENT TYPE:  OUT   LOCATION:  RAD                           FACILITY:  [facility]   PHYSICIAN:  Erick Colace, M.D.DATE OF BIRTH:  05-May-1956   DATE OF PROCEDURE:  02/15/2008  DATE OF DISCHARGE:                               OPERATIVE REPORT   PROCEDURE PERFORMED:  This is a left L5 transforaminal lumbar epidural  steroid injection done under fluoroscopic guidance.   INDICATIONS:  Lumbosacral radiculitis.  The pain goes down to the left  foot.  The pain interferes with household duties, shopping and walking.  Tolerance is only partially responsive to medication management.   PROCEDURE:  The patient was placed prone on the fluoroscopy table.  Betadine prep, sterile drape and a 25-gauge 1.5 inch needle was used to  anesthetize skin and subcutaneous tissue with 1% lidocaine 2 mL.  Then a  22-gauge 5-inch spinal needle was inserted under fluoroscopic guidance,  targeting the left L5-S1 intervertebral foramen.  AP lateral and oblique  imaging utilized.  Omnipaque 180 x 0.5 mL demonstrated good nerve root  outline.  Then, a solution containing 1 mL of 40 mL Depo-Medrol and 2 mL  of 1% MPF lidocaine were injected.   The patient tolerated the procedure well.  Pre and post injection vitals  were stable.  Post injection instructions were given.  The patient is to  return in 1 month for repeat injection.      Erick Colace, M.D.  Electronically Signed     AEK/MEDQ  D:  02/15/2008 16:27:07  T:  02/16/2008 91:47:82  Job:  [id number]

## 2011-05-03 NOTE — H&P (Signed)
Penny Cox, Penny Cox             ACCOUNT NO.:  [id number]   MEDICAL RECORD NO.:  [id number]          PATIENT TYPE:  INP   LOCATION:  IC04                          FACILITY:  [facility]   PHYSICIAN:  Catalina Pizza, M.D.        DATE OF BIRTH:  1956/04/01   DATE OF ADMISSION:  09/10/2007  DATE OF DISCHARGE:  LH                              HISTORY & PHYSICAL   PRIMARY DOCTOR:  Dr. John Giovanni.   CHIEF COMPLAINT:  Apparent overdose.   HISTORY OF PRESENT ILLNESS:  Penny Cox is a 55 year old white female  who, per the patient's report, has been feeling more and more depressed  and many things going on as far as repossession of her trailer.  She  states that her daughter's do not love her and she attempted suicide  earlier by taking a handful of pills.  She believes both Lyrica and  doxepin what she took, although the story has varied.  She took that  approximately 3:00 p.m. and was found by her oldest daughter, and 911  was called and she was brought into the emergency department with  approximate arrival about 5:20.  She initially had some altered mental  status, but was arousable; not specifically answering questions  appropriately at that time, but she was very somnolent and sleepy, and  prior to admission to the ICU has aroused more, is able to answer  questions a little more appropriately at this time.   PAST MEDICAL HISTORY:  Significant for hepatitis C, left leg neuropathy,  chronic back pain, A history of 2 C-sections before, some type of  depression.  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   HOME MEDICATIONS:  1. Ibuprofen 800 mg t.i.d. p.r.n.  2. Doxepin 50 mg 5 capsules by mouth at bedtime.  These were filled on      August 24, 2007; quantity of approximately 150.  She has      approximately 40 remaining.  3. Lyrica 150 mg 2 capsules by mouth b.i.d.  She had this filled on      September 13, 2007, and  she has approximately 35 of 40 remaining in      that.  4. She has alprazolam 1-mg tablets 4 times a day as needed for      anxiety; quantity on that was 120.  She had it last filled on      September 07, 2007.  5. Amitriptyline 25 mg 2 tablets at bedtime.  Last date of this filled      is August 09, 2007.  She has approximately 12 remaining in that.  6. Depakote ER 500 mg 4 tablets every day, prescribed by Dr. Curly Shores on      August 19, 2007.  7. Another prescription for amitriptyline 25 mg 2 at bedtime, filled      on September 05, 2007.  This is a full prescription, approximately      60-62 pills in that quantity.   SOCIAL HISTORY:  She lives in a trailer.  She does  not work.  She is  currently out of work, seeking disability due to chronic back pain and  neuropathy.  Her eldest daughter, age 71, lives with her but apparently  is moving out.  She smokes approximately 1 pack per day.  Denies any  other illicit drugs or alcohol use.   FAMILY HISTORY:  Not taken at this time.   REVIEW OF SYSTEMS:  The patient states she is having some mild chest  pressure, but denies any problems with nausea or vomiting.  No shortness  of breath.  No specific abdominal pains.  No problems with bowel or  bladder.  States her mouth is very dry.  She notes lower back and left  leg pain.   PHYSICAL EXAM:  VITAL SIGNS:  Prior to admission to ICU, systolic blood  pressure 146, diastolic 83, pulse 73, afebrile, sating 97% on room air.  GENERAL:  This is a white female lying in bed with her eyes closed, but  conversant.  Pupils are dilated but reactive bilaterally.  HEAD AND FACE:  Normocephalic, atraumatic.  Mucous membranes are very  dry.  NECK:  Supple.  CARDIOVASCULAR:  Regular rate and rhythm.  No murmurs, gallops or rubs.  RESPIRATORY:  Clear to auscultation bilaterally.  No crackles or  wheezing.  ABDOMEN:  Soft, protuberant.  Positive bowel sounds.  EXTREMITIES:  No lower extremity edema.  Able  to move all extremities,  but definitely has pain to palpation to her left leg to even the  slightest amount of palpation.  No significant difference between legs  bilaterally.  No Homans sign or calf tenderness.  NEUROLOGIC:  The patient is alert and oriented x3.  Slow response to  questions, but answering questions appropriately. PSYCHIATRIC:  Visibly  depressed and suicidal, suicidality continues.  SKIN:  Has some superficial abrasions on her lower extremities.   LABORATORY DATA OBTAINED:  A CBC showed a white count 7.1, hemoglobin of  13.3, platelet count of 170.  UA was negative.  Specific gravity 1.010.  CMET shows sodium 140, potassium 4, chloride 105, CO2 30, glucose 99,  BUN 4, creatinine 0.74, total bili of 0.6, alk phos 87, SGOT of 19, SGPT  13, total protein 7.1, albumin of 3.7, calcium of 9.5.  Alcohol less  than five.  Valproic acid is 20.   EKG shows normal sinus rhythm, some mild rightward axis.  Question some  mild prolonged Q-T, approximately 430.  The QRS complex approximately  measured at 114.   IMPRESSION:  This is a 55 year old with a significant psych history, who  appeared to have attempted overdose with doxepin and Lyrica per her  report, although she is missing many of her alprazolam which were just  filled approximately 3 days ago.   ASSESSMENT AND PLAN:  1. Overdose on doxepin and question alprazolam.  The patient is alert      and responsive more at this time, still somnolent but arousable.      She is admitted to intensive care unit for closer monitoring for      further Q-T prolongation and any signs of seizure-type activity.      Unclear exactly how much of these medicines that she took and which      dose she took.  We will contact Poison Control and see if there is      anything further that needs to be monitored on the patient for      this.  Given this overdose attempt and suicidal  ideation, she      definitely needs to have ACT Team involved and  we will continue on      suicide precautions at this time, and we will continue the      intensive care unit for continue monitoring until stability is      apparent.  2. Question of pseudoseizure/seizure activity.  She is on valproic      acid but would not think they would continue on this if  there was      not some signs of true seizure-type activity, although Dr. Nash Shearer,      who is the neurologist, did not have her on this medicine as      prescribed by a psychiatrist, and possibly whether this related to      bipolar type disease, but it is unclear exact psych history.   DISPOSITION:  The patient will be continually monitored in ICU and will  follow up with Dr. Sudie Bailey in the morning.  We will continue on her  bicarbonate drip to alkalize her blood if in fact she had taken a  significant amount of doxepin, and we will continue to monitor her on  telemetry as far as further Q-T prolongation, repeat her EKG.  CNS  depression is the largest concern given all this, but the patient is  more alert.      Catalina Pizza, M.D.  Electronically Signed     ZH/MEDQ  D:  09/10/2007  T:  09/11/2007  Job:  [id number]

## 2011-05-03 NOTE — Assessment & Plan Note (Signed)
Patient returns today.  I last saw her October 19, 2007.  She no-showed  on November 01, 2007.  [age]-year-old female with left lateral  femoral cutaneous nerve neuropathy, which was improved with an  electrostimulation guided block of that nerve.  She has had no  significant recurrence of that pain.  Still has some milder pain in that  hip.  Her main concern is her foot pain.  She has pain in bilateral  feet.  She does not have any cold feelings. She does feel like the pain  is in her joints or in her bones.  She has had no foot swelling.  She  has a past history of fibromyalgia.   PAST MEDICAL HISTORY:  Also includes inpatient hospitalization in  September for an overdose of Lyrica plus Doxepin.  She is following with  outpatient psychiatry.   Her pain level is 9/10, interferes with activity at a 2/10 level.  Sleep  is poor.  Pain is worse with walking as well as with inactivity.  She  can walk five minutes at a time.  She does not climb steps, she does not  drive, but the driving is not due to physical dysfunction.  She is on  disability.  She has some difficulty with household duties and shopping.   REVIEW OF SYSTEMS:  Depression, anxiety, tingling in the feet,  constipation, poor appetite.   EXAMINATION:  Gait is normal.  Mood and affect are appropriate.  Lower  extremity strength is 5/5 in the hip flexor, knee extensor, ankle  dorsiflexor.  She has normal sensation in lower extremities.  She has  normal pedal pulses.  Posterior tibial pulses are weak.  Feet are warm.  She has no hypersensitivity to touch in her feet.  In fact, she states  that palpation of the feet and compression feel good.  She has no skin  breakdown on her feet.  She has no evidence of joint swelling in the  ankle, metatarsals or phalanges.   IMPRESSION:  1. Meralgia paresthetica.  Improved.  2. Lower extremity dysesthesias.  She states she has had an EMG NCV,      which is negative in the past.  This  was checking the peroneal and      posterior tibial nerves.  She states that this is also very      painful.  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  This may be causing some problems in terms of her lower      extremities given negative EMG.  Also likely is that this could be      related to her fibromyalgia as they frequently have paresthesias      without evidence of nerve dysfunction.   RECOMMENDATIONS:  1. Would do transforaminal epidural steroid injection L5-S1.  Because      she is taking high dose of ibuprofen, cannot do it today.  2. Medication management.  She has difficulty with complying correctly      with her medications as evidenced by her overdose last September      and she is currently over using ibuprofen 800 mg every two hours.      I have told her not to do this and have written a prescription for      Celebrex 200 b.i.d. she can take in its place, one  week supply, and      she can continue this and this will not interfere with the epidural      injection next week.   Overall I do not think she is a good candidate for narcotic analgesics  secondary to medication misuse in the past.  Her other medications  include Lyrica 200 mg b.i.d.  She is no longer on gabapentin.  She is on  Elavil 25 mg 2 tablets a day as well as Doxepin 75 mg per day.  Would  recommend being only on one tricyclic as well.  May benefit from  Capsaicin cream for her feet as well.   I will reschedule next week for epidural injection.      Erick Colace, M.D.  Electronically Signed     [facility]  D:  02/07/2008 10:28:40  T:  02/07/2008 18:27:07  Job #:  [id number]   cc:   Janice Coffin, Dr.   Estanislado Pandy, MD  Fax: [phone]   [place], Dr.   Lonia Blood, M.D.

## 2011-05-19 NOTE — Telephone Encounter (Signed)
Penny Cox is aware.

## 2011-06-09 ENCOUNTER — Other Ambulatory Visit: Payer: Self-pay | Admitting: Family Medicine

## 2011-06-16 ENCOUNTER — Ambulatory Visit (HOSPITAL_COMMUNITY)
Admission: RE | Admit: 2011-06-16 | Discharge: 2011-06-16 | Disposition: A | Discharge status: Home / Self Care | Payer: Medicare PPO | Source: Ambulatory Visit | Attending: Pain Medicine | Admitting: Pain Medicine

## 2011-06-16 ENCOUNTER — Other Ambulatory Visit (HOSPITAL_COMMUNITY): Payer: Self-pay | Admitting: *Deleted

## 2011-06-16 DIAGNOSIS — M25519 Pain in unspecified shoulder: Secondary | ICD-10-CM

## 2011-06-16 DIAGNOSIS — M542 Cervicalgia: Secondary | ICD-10-CM

## 2011-06-16 DIAGNOSIS — M5030 Other cervical disc degeneration, unspecified cervical region: Secondary | ICD-10-CM | POA: Insufficient documentation

## 2011-06-21 ENCOUNTER — Encounter (HOSPITAL_COMMUNITY): Payer: Medicare PPO | Admitting: Psychiatry

## 2011-06-28 ENCOUNTER — Other Ambulatory Visit: Payer: Self-pay | Admitting: Family Medicine

## 2011-07-14 ENCOUNTER — Encounter (HOSPITAL_COMMUNITY): Payer: Medicare PPO | Admitting: Psychiatry

## 2011-07-14 ENCOUNTER — Encounter (INDEPENDENT_AMBULATORY_CARE_PROVIDER_SITE_OTHER): Payer: Medicare PPO | Admitting: Psychiatry

## 2011-07-14 DIAGNOSIS — F3189 Other bipolar disorder: Secondary | ICD-10-CM

## 2011-08-19 ENCOUNTER — Telehealth: Payer: Self-pay | Admitting: Family Medicine

## 2011-08-23 NOTE — Telephone Encounter (Signed)
She will need tio find a pain management team, I will not be prescribing pain medication for her , please let her know

## 2011-08-24 MED ORDER — CYCLOBENZAPRINE HCL 10 MG PO TABS: 10.0000 mg | ORAL_TABLET | Freq: Every day | ORAL | Status: DC

## 2011-08-24 MED ORDER — MELOXICAM 15 MG PO TABS: 15.0000 mg | ORAL_TABLET | Freq: Every day | ORAL | Status: DC

## 2011-08-24 NOTE — Telephone Encounter (Signed)
pls erx meloxicam 15mg  one daily #30 refill 1, and flexeril 10mg  one at night #30 refill one, let her know.  SHE NEEDS appt in office in 6 to7 weeks

## 2011-08-24 NOTE — Telephone Encounter (Signed)
meds sent, patient aware 

## 2011-08-24 NOTE — Telephone Encounter (Signed)
Patient states she does not want a narcotic, can you prescribe anti inflammatory and muscle relaxer

## 2011-08-25 ENCOUNTER — Ambulatory Visit (INDEPENDENT_AMBULATORY_CARE_PROVIDER_SITE_OTHER): Payer: Medicare PPO | Admitting: Family Medicine

## 2011-08-25 ENCOUNTER — Encounter: Payer: Self-pay | Admitting: Family Medicine

## 2011-08-25 VITALS — BP 106/70 | HR 86 | Resp 16 | Ht 71.0 in | Wt 245.0 lb

## 2011-08-25 DIAGNOSIS — Z23 Encounter for immunization: Secondary | ICD-10-CM

## 2011-08-25 DIAGNOSIS — L0390 Cellulitis, unspecified: Secondary | ICD-10-CM

## 2011-08-25 DIAGNOSIS — L0291 Cutaneous abscess, unspecified: Secondary | ICD-10-CM | POA: Insufficient documentation

## 2011-08-25 MED ORDER — SULFAMETHOXAZOLE-TRIMETHOPRIM 800-160 MG PO TABS: 1.0000 | ORAL_TABLET | Freq: Two times a day (BID) | ORAL | Status: AC

## 2011-08-25 MED ORDER — INFLUENZA VAC TYPES A & B PF IM SUSP: 0.5000 mL | Freq: Once | INTRAMUSCULAR | Status: DC

## 2011-08-25 MED ORDER — HYDROCODONE-ACETAMINOPHEN 5-500 MG PO TABS: 1.0000 | ORAL_TABLET | ORAL | Status: DC | PRN

## 2011-08-25 NOTE — Progress Notes (Signed)
  Subjective:    Patient ID: Penny Cox, female    DOB: February 18, 1956, 55 y.o.   MRN: [id number]  HPI   Draining lesion x 1 week on buttock, feels like it is moving toward her anal region, initially felt like she had a "goose egg" on her buttock, it started draining this week -yellow fluid and blood , painful to touch,   no previous history of Boils, ROS-  no blood in stool or urine, no recent antibiotics, no fever, no chills, no vaginal discharge  Wants flu shot  Note she stopped going to pain clinic 1 month ago, because she could not get transportation to [place] , she also has not had money for Co-pay   Review of Systems per above      Objective:   Physical Exam  GEN- NAD, alert and oriented  Skin- Right buttocks- 3x2cm area of fluctuance and erythema- note raw area of peeling skin surrounding  small opening draining small amount of yellow pus and serousanginous fluid, pt underwear were soaked in yellow fluid over the lesion. Area of induration extended another 3cm around lesion, no anal involvement, TTP, foul odor from lesion   Procedure Note- I & D- Right buttocks   Procedure explained, all questions answered, Consent form signed   1% lidocaine with Epi- approx 3ml used for anesthetic   Area cleaned with normal saline   Small incision made- small amount of pus expressed from deep pocket,    approx 3 inches of packing placed     Assessment & Plan:    Abscess- s/p I & D, with surrounding cellulits. Treat with Bactrim DS BID x 10 days, culture sent    Given 12 tablets of hydrocdone - pt understands this is short term

## 2011-08-25 NOTE — Patient Instructions (Signed)
Abscess/Boil Care After (Furuncle) An abscess (also called a boil or furuncle) is an infected area that contains a collection of pus. Signs and symptoms of an abscess include pain, tenderness, redness, or hardness, or you may feel a moveable soft area under your skin. An abscess can occur anywhere in the body. The infection may spread to surrounding tissues causing cellulitis. A cut (incision) by the surgeon was made over your abscess and the pus was drained out. Gauze may have been packed into the space to provide a drain that will allow the cavity to heal from the inside outwards. The boil may be painful for 5 to 7 days. Most people with a boil do not have high fevers. Your abscess, if seen early, may not have localized, and may not have been lanced. If not, another appointment may be required for this if it does not get better on its own or with medications. HOME CARE INSTRUCTIONS  Only take over-the-counter or prescription medicines for pain, discomfort, or fever as directed by your caregiver.   When you bathe, soak and then remove gauze or iodoform packs at least daily or as directed by your caregiver. You may then wash the wound gently with mild soapy water. Repack with gauze or do as your caregiver directs.  SEEK IMMEDIATE MEDICAL CARE IF:  You develop increased pain, swelling, redness, drainage, or bleeding in the wound site.   You develop signs of generalized infection including muscle aches, chills, fever, or a general ill feeling.   An oral temperature above 101F develops, not controlled by medication.  See your caregiver for a recheck if you develop any of the symptoms described above. If medications (antibiotics) were prescribed, take them as directed. Document Released: 06/23/2005 Document Re-Released: 05/25/2010 [facility] Patient Information 2011 [place], [place].  Return for a recheck on the area tomorrow. If for some reason you do not make it in, you need to remove the packing on  Sat, continue antibiotics and keep the area clean.

## 2011-08-26 ENCOUNTER — Encounter: Payer: Self-pay | Admitting: Family Medicine

## 2011-08-26 ENCOUNTER — Ambulatory Visit (INDEPENDENT_AMBULATORY_CARE_PROVIDER_SITE_OTHER): Payer: Medicare PPO | Admitting: Family Medicine

## 2011-08-26 ENCOUNTER — Telehealth: Payer: Self-pay | Admitting: Family Medicine

## 2011-08-26 VITALS — BP 104/60 | HR 67 | Resp 16 | Ht 71.0 in | Wt 252.0 lb

## 2011-08-26 DIAGNOSIS — L0291 Cutaneous abscess, unspecified: Secondary | ICD-10-CM

## 2011-08-26 NOTE — Progress Notes (Signed)
  Subjective:    Patient ID: Penny Cox, female    DOB: September 16, 1956, 55 y.o.   MRN: [id number]  HPI Pt here to f/u cellulitis/abscess- s/p I and D yesterday. Currently taking antibioitcs and pain medications Pain is much better, the packing fell out today in the shower, she has had mild watery bloody drainage, no pus No fever, no chills   Review of Systems - per above     Objective:   Physical Exam EN- NAD, alert and oriented  Skin- Right buttocks-   Incision clean and dry on right buttocks, no pus expressed, +inudration no fluctuant areas, cellulitis surround improved- now surrounds approx 2cm , TTP over open lesion        Assessment & Plan:

## 2011-08-26 NOTE — Patient Instructions (Signed)
Continue your antibiotics Use a piece of gauze and put in the opening. You may shower If you get fever, chills, or boil gets larger go to the ER F/U in 1 week

## 2011-08-26 NOTE — Telephone Encounter (Signed)
I see this was only written for 12 tabs with no refills. She is requesting a refill. Ok to send in?

## 2011-08-27 NOTE — Telephone Encounter (Signed)
Do not refdill, pt is getting meloxicam, no hydrocodone form this office and I will send Dr Jeanice Lim a message also

## 2011-08-28 LAB — WOUND CULTURE: Gram Stain: NONE SEEN

## 2011-08-28 NOTE — Assessment & Plan Note (Signed)
Abscess site looks good today, minimal drainage noted, no frank pus, continue antibiotics. MRSA on culture. F/U in 1 week to ensure proper closure of wound and completion of antibiotics

## 2011-09-02 ENCOUNTER — Ambulatory Visit: Payer: Medicare PPO | Admitting: Family Medicine

## 2011-09-02 ENCOUNTER — Encounter: Payer: Self-pay | Admitting: Family Medicine

## 2011-09-05 ENCOUNTER — Encounter: Payer: Self-pay | Admitting: Family Medicine

## 2011-09-05 ENCOUNTER — Ambulatory Visit (INDEPENDENT_AMBULATORY_CARE_PROVIDER_SITE_OTHER): Payer: Medicare PPO | Admitting: Family Medicine

## 2011-09-05 VITALS — BP 120/80 | HR 85 | Resp 16 | Ht 71.0 in | Wt 246.4 lb

## 2011-09-05 DIAGNOSIS — F341 Dysthymic disorder: Secondary | ICD-10-CM

## 2011-09-05 DIAGNOSIS — R5381 Other malaise: Secondary | ICD-10-CM

## 2011-09-05 DIAGNOSIS — F17200 Nicotine dependence, unspecified, uncomplicated: Secondary | ICD-10-CM

## 2011-09-05 DIAGNOSIS — E785 Hyperlipidemia, unspecified: Secondary | ICD-10-CM

## 2011-09-05 DIAGNOSIS — F329 Major depressive disorder, single episode, unspecified: Secondary | ICD-10-CM

## 2011-09-05 DIAGNOSIS — R5383 Other fatigue: Secondary | ICD-10-CM

## 2011-09-05 DIAGNOSIS — I10 Essential (primary) hypertension: Secondary | ICD-10-CM

## 2011-09-05 NOTE — Patient Instructions (Addendum)
F/u in February.   Please think about quitting smoking.  This is very important for your health.  Consider setting a quit date, then cutting back or switching brands to prepare to stop.  Also think of the money you will save every day by not smoking.  Quick Tips to Quit Smoking: Fix a date i.e. keep a date in mind from when you would not touch a tobacco product to smoke  Keep yourself busy and block your mind with work loads or reading books or watching movies in malls where smoking is not allowed  Vanish off the things which reminds you about smoking for example match box, or your favorite lighter, or the pipe you used for smoking, or your favorite jeans and shirt with which you used to enjoy smoking, or the club where you used to do smoking  Try to avoid certain people places and incidences where and with whom smoking is a common factor to add on  Praise yourself with some token gifts from the money you saved by stopping smoking  Anti Smoking teams are there to help you. Join their programs  Anti-smoking Gums are there in many medical shops. Try them to quit smoking   Side-effects of Smoking: Disease caused by smoking cigarettes are emphysema, bronchitis, heart failures  Premature death  Cancer is the major side effect of smoking  Heart attacks and strokes are the quick effects of smoking causing sudden death  Some smokers lives end up with limbs amputated  Breathing problem or fast breathing is another side effect of smoking  Due to more intakes of smokes, carbon mono-oxide goes into your brain and other muscles of the body which leads to swelling of the veins and blockage to the air passage to lungs  Carbon monoxide blocks blood vessels which leads to blockage in the flow of blood to different major body organs like heart lungs and thus leads to attacks and deaths  During pregnancy smoking is very harmful and leads to premature birth of the infant, spontaneous abortions, low weight of the  infant during birth  Fat depositions to narrow and blocked blood vessels causing heart attacks  In many cases cigarette smoking caused infertility in men    Fasting labs by October 3 as stated  No med changes except stop meloxicam after you completethe ones you have.  Pls plan to cotinue with Dr Lolly Mustache  pls schedule your mammogram

## 2011-09-06 ENCOUNTER — Other Ambulatory Visit: Payer: Self-pay

## 2011-09-06 DIAGNOSIS — R5383 Other fatigue: Secondary | ICD-10-CM

## 2011-09-06 DIAGNOSIS — M5090 Cervical disc disorder, unspecified, unspecified cervical region: Secondary | ICD-10-CM

## 2011-09-06 DIAGNOSIS — F329 Major depressive disorder, single episode, unspecified: Secondary | ICD-10-CM

## 2011-09-06 DIAGNOSIS — E785 Hyperlipidemia, unspecified: Secondary | ICD-10-CM

## 2011-09-06 DIAGNOSIS — I10 Essential (primary) hypertension: Secondary | ICD-10-CM

## 2011-09-06 DIAGNOSIS — G8929 Other chronic pain: Secondary | ICD-10-CM

## 2011-09-06 DIAGNOSIS — N760 Acute vaginitis: Secondary | ICD-10-CM

## 2011-09-06 MED ORDER — LISINOPRIL 20 MG PO TABS: 20.0000 mg | ORAL_TABLET | Freq: Every day | ORAL | Status: DC

## 2011-09-06 MED ORDER — OMEPRAZOLE 20 MG PO CPDR: 20.0000 mg | DELAYED_RELEASE_CAPSULE | Freq: Two times a day (BID) | ORAL | Status: DC

## 2011-09-06 MED ORDER — PRAVASTATIN SODIUM 40 MG PO TABS: 40.0000 mg | ORAL_TABLET | Freq: Every day | ORAL | Status: DC

## 2011-09-07 ENCOUNTER — Ambulatory Visit: Payer: Medicare PPO | Admitting: Family Medicine

## 2011-09-08 ENCOUNTER — Encounter (INDEPENDENT_AMBULATORY_CARE_PROVIDER_SITE_OTHER): Payer: Medicare PPO | Admitting: Psychiatry

## 2011-09-08 DIAGNOSIS — F3189 Other bipolar disorder: Secondary | ICD-10-CM

## 2011-09-11 NOTE — Progress Notes (Signed)
  Subjective:    Patient ID: Penny Cox, female    DOB: [birth year], 55 y.o.   MRN: [id number]  HPI The PT is here for follow up and re-evaluation of chronic medical conditions, medication management and review of any available recent lab and radiology data.  Preventive health is updated, specifically  Cancer screening and Immunization.   Questions or concerns regarding consultations or procedures which the PT has had in the interim are  addressed. The PT denies any adverse reactions to current medications since the last visit.  Pt has essentially weaned off of chronic narcotic meds which is to her credit, she did have problems at more than one pain center in the past. She recently had I/D of a rectal abces, and is here to have this reviewed also. She asks if I would take over her psych meds since she has a poor attendance record with her psych, I advised her of the need to maintain psychaitric care, and stated clearly I would not accept the responsibility. She denies hallucinations, suicidal or homicidal thoughts currently, but has a significant history    Review of Systems See HPI Denies recent fever or chills. Denies sinus pressure, nasal congestion, ear pain or sore throat. Denies chest congestion, productive cough or wheezing. Denies chest pains, palpitations and leg swelling Denies abdominal pain, nausea, vomiting,diarrhea or constipation.   Denies dysuria, frequency, hesitancy or incontinence. Chronic back pain Denies headaches, seizures, numbness, or tingling. Denies depression, anxiety or insomnia. Reports healing wound, denies any associated pain or significant drainage      Objective:   Physical Exam Patient alert and oriented and in no cardiopulmonary distress.  HEENT: No facial asymmetry, EOMI, no sinus tenderness,  oropharynx pink and moist.  Neck supple no adenopathy.  Chest: Clear to auscultation bilaterally.Decreased air entry bilaterally  CVS: S1, S2 no  murmurs, no S3.  ABD: Soft non tender. Bowel sounds normal.  Ext: No edema  MS: Decreased  ROM spine,adequate in  shoulders, hips and knees.  Skin:recently incised wound is healing well with no erythema or drainage.  Psych: Good eye contact, normal affect. Memory intact not anxious or depressed appearing.  CNS: CN 2-12 intact, power, tone and sensation normal throughout.        Assessment & Plan:

## 2011-09-11 NOTE — Assessment & Plan Note (Signed)
Currently stable and well controlled, pt to follow with psych

## 2011-09-11 NOTE — Assessment & Plan Note (Signed)
Unchanged, no plans to quit

## 2011-09-11 NOTE — Assessment & Plan Note (Signed)
Controlled, no change in medication  

## 2011-09-11 NOTE — Assessment & Plan Note (Signed)
Elevated LDL, low fat diet counseling done, pt encouraged to follow this

## 2011-09-29 LAB — CBC
HCT: 38.2
Hemoglobin: 13.3
MCHC: 33.9
Platelets: 164
RDW: 14.5 — ABNORMAL HIGH
WBC: 5.2

## 2011-09-29 LAB — BASIC METABOLIC PANEL
BUN: 3 — ABNORMAL LOW
Calcium: 9.3
GFR calc non Af Amer: >60
Glucose, Bld: 115 — ABNORMAL HIGH

## 2011-09-29 LAB — RAPID URINE DRUG SCREEN, HOSP PERFORMED
Amphetamines: NOT DETECTED
Barbiturates: NOT DETECTED
Benzodiazepines: POSITIVE — AB
Cocaine: NOT DETECTED
Opiates: NOT DETECTED

## 2011-09-29 LAB — URINALYSIS, ROUTINE W REFLEX MICROSCOPIC
Bilirubin Urine: NEGATIVE
Glucose, UA: NEGATIVE
Hgb urine dipstick: NEGATIVE
Protein, ur: NEGATIVE

## 2011-09-29 LAB — VALPROIC ACID LEVEL: Valproic Acid Lvl: 72.7

## 2011-09-29 LAB — DIFFERENTIAL
Basophils Absolute: 0
Eosinophils Relative: 6 — ABNORMAL HIGH
Lymphocytes Relative: 26
Lymphocytes Relative: 47 — ABNORMAL HIGH
Lymphs Abs: 1.8
Lymphs Abs: 2.4
Monocytes Relative: 7
Neutro Abs: 4.7
Neutro Abs: 2
Neutrophils Relative %: 67

## 2011-09-29 LAB — HEPATIC FUNCTION PANEL
ALT: 13
Albumin: 3.4 — ABNORMAL LOW
Alkaline Phosphatase: 74
Alkaline Phosphatase: 81
Bilirubin, Direct: 0.1
Indirect Bilirubin: 0.4
Indirect Bilirubin: 0.5
Total Bilirubin: 0.5
Total Protein: 6.2

## 2011-09-29 LAB — COMPREHENSIVE METABOLIC PANEL
ALT: 13
BUN: 4 — ABNORMAL LOW
Calcium: 9.5
Glucose, Bld: 99
Sodium: 140
Total Protein: 7.1

## 2011-09-29 LAB — TRICYCLICS SCREEN, URINE: TCA Scrn: POSITIVE — AB

## 2011-10-18 ENCOUNTER — Other Ambulatory Visit: Payer: Self-pay | Admitting: Family Medicine

## 2011-11-01 ENCOUNTER — Encounter (HOSPITAL_COMMUNITY): Payer: Medicare PPO | Admitting: Psychiatry

## 2011-11-01 ENCOUNTER — Ambulatory Visit (INDEPENDENT_AMBULATORY_CARE_PROVIDER_SITE_OTHER): Payer: Medicare PPO | Admitting: Psychiatry

## 2011-11-01 ENCOUNTER — Encounter (HOSPITAL_COMMUNITY): Payer: Self-pay | Admitting: Psychiatry

## 2011-11-01 DIAGNOSIS — M5090 Cervical disc disorder, unspecified, unspecified cervical region: Secondary | ICD-10-CM

## 2011-11-01 DIAGNOSIS — F329 Major depressive disorder, single episode, unspecified: Secondary | ICD-10-CM

## 2011-11-01 DIAGNOSIS — E785 Hyperlipidemia, unspecified: Secondary | ICD-10-CM

## 2011-11-01 DIAGNOSIS — G8929 Other chronic pain: Secondary | ICD-10-CM

## 2011-11-01 DIAGNOSIS — M549 Dorsalgia, unspecified: Secondary | ICD-10-CM

## 2011-11-01 DIAGNOSIS — R5381 Other malaise: Secondary | ICD-10-CM

## 2011-11-01 DIAGNOSIS — I10 Essential (primary) hypertension: Secondary | ICD-10-CM

## 2011-11-01 DIAGNOSIS — N760 Acute vaginitis: Secondary | ICD-10-CM

## 2011-11-01 MED ORDER — DULOXETINE HCL 60 MG PO CPEP: 60.0000 mg | ORAL_CAPSULE | Freq: Every day | ORAL | Status: DC

## 2011-11-01 MED ORDER — DOXEPIN HCL 150 MG PO CAPS: ORAL_CAPSULE | ORAL | Status: DC

## 2011-11-01 MED ORDER — DIVALPROEX SODIUM ER 500 MG PO TB24: 500.0000 mg | ORAL_TABLET | Freq: Every day | ORAL | Status: DC

## 2011-11-01 MED ORDER — ALPRAZOLAM 0.5 MG PO TABS: 0.5000 mg | ORAL_TABLET | Freq: Every evening | ORAL | Status: DC | PRN

## 2011-11-01 NOTE — Progress Notes (Signed)
Patient came today for her followup appointment. She's been compliant with her medication including her nonpsychiatric medications. Her pain remains at time worse but she is not taking any oxymorphone and Mobic at this time. she does take Voltaren Jel and hydrocodone. She continues to have some anxiety attack when she go outside but overall she feels her current medication is helping her depression and anxiety. She is able to sleep 6-7 hours. Lately she does not have any panic or nervous attack. She denies any agitation anger or mood swings. She denies any alcohol or drug use but admits that she spoke half pack every day.  Mental status examination. Patient appears tired but cooperative and maintained fair eye contact. Her speech is soft and clear and coherent. Her thought process is also slow but logical and linear. She denies an active or passive suicidal thinking or homicidal thinking. There were no psychotic symptoms present. She is alert and oriented x3. Her attention and concentration is fair. Her insight judgment and impulse control is okay   Assessment depressive disorder NOS, anxiety disorder NOS.  Plan I will continue her Cymbalta Depakote Xanax and doxepin. I have explained the risks and benefits of medication including metabolic side effects of doxepin. I have also encouraged to see her primary care doctor for pain management. I will see her again in 3 months.

## 2011-11-07 ENCOUNTER — Other Ambulatory Visit (HOSPITAL_COMMUNITY): Payer: Self-pay | Admitting: *Deleted

## 2011-11-07 DIAGNOSIS — F3189 Other bipolar disorder: Secondary | ICD-10-CM

## 2011-11-07 MED ORDER — DOXEPIN HCL 150 MG PO CAPS: 150.0000 mg | ORAL_CAPSULE | Freq: Every day | ORAL | Status: DC

## 2011-11-18 ENCOUNTER — Other Ambulatory Visit: Payer: Self-pay | Admitting: Family Medicine

## 2011-11-22 ENCOUNTER — Other Ambulatory Visit (HOSPITAL_COMMUNITY): Payer: Self-pay | Admitting: Psychiatry

## 2011-11-28 ENCOUNTER — Other Ambulatory Visit (HOSPITAL_COMMUNITY): Payer: Self-pay | Admitting: Psychiatry

## 2011-12-01 NOTE — Telephone Encounter (Signed)
Do you want to refill mobic? 

## 2011-12-02 ENCOUNTER — Other Ambulatory Visit: Payer: Self-pay

## 2011-12-02 NOTE — Telephone Encounter (Signed)
Refill x 2, pls

## 2011-12-29 ENCOUNTER — Ambulatory Visit (HOSPITAL_COMMUNITY): Payer: Medicare PPO | Admitting: Psychiatry

## 2011-12-29 ENCOUNTER — Encounter (HOSPITAL_COMMUNITY): Payer: Self-pay | Admitting: Psychiatry

## 2011-12-29 ENCOUNTER — Ambulatory Visit (INDEPENDENT_AMBULATORY_CARE_PROVIDER_SITE_OTHER): Payer: Medicare PPO | Admitting: Psychiatry

## 2011-12-29 VITALS — Wt 269.0 lb

## 2011-12-29 DIAGNOSIS — F3189 Other bipolar disorder: Secondary | ICD-10-CM

## 2011-12-29 DIAGNOSIS — R5383 Other fatigue: Secondary | ICD-10-CM

## 2011-12-29 DIAGNOSIS — E785 Hyperlipidemia, unspecified: Secondary | ICD-10-CM

## 2011-12-29 DIAGNOSIS — M549 Dorsalgia, unspecified: Secondary | ICD-10-CM

## 2011-12-29 DIAGNOSIS — M5090 Cervical disc disorder, unspecified, unspecified cervical region: Secondary | ICD-10-CM

## 2011-12-29 DIAGNOSIS — I10 Essential (primary) hypertension: Secondary | ICD-10-CM

## 2011-12-29 DIAGNOSIS — F329 Major depressive disorder, single episode, unspecified: Secondary | ICD-10-CM

## 2011-12-29 DIAGNOSIS — G8929 Other chronic pain: Secondary | ICD-10-CM

## 2011-12-29 DIAGNOSIS — R5381 Other malaise: Secondary | ICD-10-CM

## 2011-12-29 DIAGNOSIS — N760 Acute vaginitis: Secondary | ICD-10-CM

## 2011-12-29 DIAGNOSIS — F3289 Other specified depressive episodes: Secondary | ICD-10-CM

## 2011-12-29 MED ORDER — DIVALPROEX SODIUM ER 500 MG PO TB24: 500.0000 mg | ORAL_TABLET | Freq: Every day | ORAL | Status: DC

## 2011-12-29 MED ORDER — ALPRAZOLAM 0.5 MG PO TABS: 0.5000 mg | ORAL_TABLET | Freq: Every evening | ORAL | Status: DC | PRN

## 2011-12-29 MED ORDER — DULOXETINE HCL 60 MG PO CPEP: 60.0000 mg | ORAL_CAPSULE | Freq: Every day | ORAL | Status: DC

## 2011-12-29 MED ORDER — DOXEPIN HCL 150 MG PO CAPS: 150.0000 mg | ORAL_CAPSULE | Freq: Every day | ORAL | Status: DC

## 2011-12-29 NOTE — Progress Notes (Signed)
Patient came for her followup appointment. She continued to endorse chronic back pain which is getting worse in cold season. She reported that she does not have enough money to see pain Dr. she see Dr. Lodema Hong who is her primary care physician and she has been prescribing Voltaren and Mobic. She is not taking any hydrocodone and oxycodone. She continues to have some anxiety attack when she go outside but she feels her doxepin Depakote Cymbalta and Xanax is working good. Patient is scheduled to have some blood work next month. She denies any recent agitation anger or crying spells. She reported no side effects of medication.  Mental status examination Patient is casually dressed and fairly groomed. She appears tired and she walk for her appointment. She does not have any transportation. Her speech is soft clear and coherent. She described her mood is tired and her affect is constricted. She denies any active or passive suicidal thinking and homicidal thinking. There were no delusions or psychotic symptoms present. Her attention and concentration is fair. There were no extrapyramidal side effects present. She's alert and oriented x3. Her insight judgment and impulse control is okay  Assessment Depressive disorder NOS Anxiety disorder NOS  Plan I will continue her current medication. I have explained in depth about risks and benefits of her current medication including metabolic side effects and interaction with her pain medication. Patient has been taking these medication for past any years without any side effects. She does not drink alcohol or abuse is her benzodiazepine. I will also order Depakote level hemoglobin A1c CBC and chemistry. I will see her again in 2 months.

## 2012-01-03 ENCOUNTER — Other Ambulatory Visit (HOSPITAL_COMMUNITY): Payer: Self-pay | Admitting: Psychiatry

## 2012-01-19 ENCOUNTER — Encounter: Payer: Self-pay | Admitting: Family Medicine

## 2012-01-19 ENCOUNTER — Other Ambulatory Visit (HOSPITAL_COMMUNITY): Payer: Self-pay | Admitting: Psychiatry

## 2012-01-21 LAB — CBC WITH DIFFERENTIAL/PLATELET
Basophils Absolute: 0 10*3/uL (ref 0.0–0.1)
Basophils Relative: 1 % (ref 0–1)
Eosinophils Absolute: 0.1 10*3/uL (ref 0.0–0.7)
Eosinophils Relative: 1 % (ref 0–5)
HCT: 45.7 % (ref 36.0–46.0)
MCHC: 32.6 g/dL (ref 30.0–36.0)
MCV: 83.5 fL (ref 78.0–100.0)
Monocytes Absolute: 0.4 10*3/uL (ref 0.1–1.0)
Platelets: 199 10*3/uL (ref 150–400)
RDW: 13.8 % (ref 11.5–15.5)
WBC: 4.4 10*3/uL (ref 4.0–10.5)

## 2012-01-21 LAB — HEPATIC FUNCTION PANEL
ALT: 11 U/L (ref 0–35)
AST: 19 U/L (ref 0–37)
Alkaline Phosphatase: 93 U/L (ref 39–117)
Bilirubin, Direct: 0.1 mg/dL (ref 0.0–0.3)
Indirect Bilirubin: 0.4 mg/dL (ref 0.0–0.9)
Total Protein: 7.7 g/dL (ref 6.0–8.3)

## 2012-01-21 LAB — HEMOGLOBIN A1C: Hgb A1c MFr Bld: 5.8 % — ABNORMAL HIGH (ref <5.7)

## 2012-01-21 LAB — LIPID PANEL
Cholesterol: 198 mg/dL (ref 0–200)
HDL: 57 mg/dL (ref >39)
LDL Cholesterol: 122 mg/dL — ABNORMAL HIGH (ref 0–99)
Triglycerides: 97 mg/dL (ref <150)

## 2012-01-21 LAB — VALPROIC ACID LEVEL: Valproic Acid Lvl: 37.5 ug/mL — ABNORMAL LOW (ref 50.0–100.0)

## 2012-01-21 LAB — BASIC METABOLIC PANEL
BUN: 7 mg/dL (ref 6–23)
Calcium: 10 mg/dL (ref 8.4–10.5)
Creat: 0.82 mg/dL (ref 0.50–1.10)

## 2012-01-23 ENCOUNTER — Encounter: Payer: Self-pay | Admitting: Family Medicine

## 2012-01-23 ENCOUNTER — Other Ambulatory Visit (HOSPITAL_COMMUNITY): Payer: Self-pay | Admitting: Psychiatry

## 2012-01-23 ENCOUNTER — Ambulatory Visit (INDEPENDENT_AMBULATORY_CARE_PROVIDER_SITE_OTHER): Payer: Medicare PPO | Admitting: Family Medicine

## 2012-01-23 VITALS — BP 120/82 | HR 77 | Resp 16 | Ht 71.0 in | Wt 255.4 lb

## 2012-01-23 DIAGNOSIS — M5090 Cervical disc disorder, unspecified, unspecified cervical region: Secondary | ICD-10-CM

## 2012-01-23 DIAGNOSIS — K5900 Constipation, unspecified: Secondary | ICD-10-CM

## 2012-01-23 DIAGNOSIS — F17200 Nicotine dependence, unspecified, uncomplicated: Secondary | ICD-10-CM

## 2012-01-23 DIAGNOSIS — F329 Major depressive disorder, single episode, unspecified: Secondary | ICD-10-CM

## 2012-01-23 DIAGNOSIS — E785 Hyperlipidemia, unspecified: Secondary | ICD-10-CM

## 2012-01-23 DIAGNOSIS — M549 Dorsalgia, unspecified: Secondary | ICD-10-CM

## 2012-01-23 DIAGNOSIS — N760 Acute vaginitis: Secondary | ICD-10-CM

## 2012-01-23 DIAGNOSIS — K219 Gastro-esophageal reflux disease without esophagitis: Secondary | ICD-10-CM

## 2012-01-23 DIAGNOSIS — J42 Unspecified chronic bronchitis: Secondary | ICD-10-CM

## 2012-01-23 DIAGNOSIS — G8929 Other chronic pain: Secondary | ICD-10-CM

## 2012-01-23 DIAGNOSIS — I10 Essential (primary) hypertension: Secondary | ICD-10-CM

## 2012-01-23 DIAGNOSIS — F341 Dysthymic disorder: Secondary | ICD-10-CM

## 2012-01-23 DIAGNOSIS — R5383 Other fatigue: Secondary | ICD-10-CM

## 2012-01-23 DIAGNOSIS — R5381 Other malaise: Secondary | ICD-10-CM

## 2012-01-23 MED ORDER — PRAVASTATIN SODIUM 80 MG PO TABS: 80.0000 mg | ORAL_TABLET | Freq: Every evening | ORAL | Status: DC

## 2012-01-23 MED ORDER — LISINOPRIL 20 MG PO TABS: 20.0000 mg | ORAL_TABLET | Freq: Every day | ORAL | Status: DC

## 2012-01-23 MED ORDER — MELOXICAM 15 MG PO TABS: 15.0000 mg | ORAL_TABLET | Freq: Every day | ORAL | Status: DC

## 2012-01-23 MED ORDER — GABAPENTIN 300 MG PO CAPS: 300.0000 mg | ORAL_CAPSULE | Freq: Every day | ORAL | Status: DC

## 2012-01-23 MED ORDER — OMEPRAZOLE 20 MG PO CPDR: 20.0000 mg | DELAYED_RELEASE_CAPSULE | Freq: Two times a day (BID) | ORAL | Status: DC

## 2012-01-23 NOTE — Assessment & Plan Note (Signed)
Improved, now 10 per day, unwilling to set quit date

## 2012-01-23 NOTE — Assessment & Plan Note (Signed)
Controlled, no change in medication  

## 2012-01-23 NOTE — Patient Instructions (Signed)
CPE in and 3 weeks.  Fasting lipid and cmp before visit. Dose increase on pravastatin to 80mg  daily, ok to take TWO 40mg  tabs till done It is important that you exercise regularly at least 30 minutes 5 times a week. If you develop chest pain, have severe difficulty breathing, or feel very tired, stop exercising immediately and seek medical attention   A healthy diet is rich in fruit, vegetables and whole grains. Poultry fish, nuts and beans are a healthy choice for protein rather then red meat. A low sodium diet and drinking 64 ounces of water daily is generally recommended. Oils and sweet should be limited. Carbohydrates especially for those who are diabetic or overweight, should be limited to 34-45 gram per meal. It is important to eat on a regular schedule, at least 3 times daily. Snacks should be primarily fruits, vegetables or nuts.   You need to try to lose 2 to 3 pounds each month

## 2012-01-23 NOTE — Assessment & Plan Note (Signed)
Persistently elevated LDL dose inc on pravachol

## 2012-01-23 NOTE — Assessment & Plan Note (Signed)
Controlled followed by psych

## 2012-01-24 LAB — WET PREP BY MOLECULAR PROBE
Candida species: NEGATIVE
Gardnerella vaginalis: POSITIVE — AB
Trichomonas vaginosis: NEGATIVE

## 2012-01-24 MED ORDER — METRONIDAZOLE 500 MG PO TABS: 500.0000 mg | ORAL_TABLET | Freq: Two times a day (BID) | ORAL | Status: DC

## 2012-01-29 NOTE — Assessment & Plan Note (Signed)
Malodorous vaginal d/c not responding to OTC med, not sexually active, likely bV, specimens sent

## 2012-01-29 NOTE — Assessment & Plan Note (Signed)
Controlled, no change in medication  

## 2012-01-29 NOTE — Assessment & Plan Note (Addendum)
Takes daily miralax to control symptom

## 2012-01-29 NOTE — Assessment & Plan Note (Signed)
Pt continues to smoke, counseled to quit, uses proventil as needed

## 2012-01-29 NOTE — Progress Notes (Signed)
  Subjective:    Patient ID: Penny Cox, female    DOB: 1956/11/22, 56 y.o.   MRN: [id number]  HPI The PT is here for follow up and re-evaluation of chronic medical conditions, medication management and review of any available recent lab and radiology data.  Preventive health is updated, specifically  Cancer screening and Immunization.   Questions or concerns regarding consultations or procedures which the PT has had in the interim are  addressed. The PT denies any adverse reactions to current medications since the last visit.  C/o malodorous vaginal d/c x 6 month   Review of Systems    See HPI Denies recent fever or chills. Denies sinus pressure, nasal congestion, ear pain or sore throat. Denies chest congestion, productive cough or wheezing. Denies chest pains, palpitations and leg swelling Denies abdominal pain, nausea, vomiting,diarrhea , takes daily med for  constipation.   Denies dysuria, frequency, hesitancy or incontinence. Denies hedaches, seizures, numbness, or tingling. C/o chronic back pqin, no specific aggravating factor Denies uncontrolled depression, anxiety or insomnia.Folloowed by mental health Denies skin break down or rash.     Objective:   Physical Exam Patient alert and oriented and in no cardiopulmonary distress.  HEENT: No facial asymmetry, EOMI, no sinus tenderness,  oropharynx pink and moist.  Neck supple no adenopathy.  Chest: Clear to auscultation bilaterally.Decreased air entry bilaterally  CVS: S1, S2 no murmurs, no S3.  ABD: Soft non tender. Bowel sounds normal. Pelvic:fishy smelling vaginal d/c , no cervical motion or adnexal tenderness Ext: No edema  MS: Adequate ROM spine, shoulders, hips and knees.  Skin: Intact, no ulcerations or rash noted.  Psych: Good eye contact,flat  affect. Memory intact not anxious or depressed appearing.  CNS: CN 2-12 intact, power, tone and sensation normal throughout.        Assessment & Plan:

## 2012-02-02 ENCOUNTER — Emergency Department (HOSPITAL_COMMUNITY): Payer: Medicare PPO

## 2012-02-02 ENCOUNTER — Encounter (HOSPITAL_COMMUNITY): Payer: Self-pay | Admitting: *Deleted

## 2012-02-02 ENCOUNTER — Emergency Department (HOSPITAL_COMMUNITY)
Admission: EM | Admit: 2012-02-02 | Discharge: 2012-02-02 | Disposition: A | Discharge status: Home / Self Care | Payer: Medicare PPO | Attending: Emergency Medicine | Admitting: Emergency Medicine

## 2012-02-02 DIAGNOSIS — Z79899 Other long term (current) drug therapy: Secondary | ICD-10-CM | POA: Insufficient documentation

## 2012-02-02 DIAGNOSIS — S335XXA Sprain of ligaments of lumbar spine, initial encounter: Secondary | ICD-10-CM | POA: Insufficient documentation

## 2012-02-02 DIAGNOSIS — S39012A Strain of muscle, fascia and tendon of lower back, initial encounter: Secondary | ICD-10-CM

## 2012-02-02 DIAGNOSIS — Y93E1 Activity, personal bathing and showering: Secondary | ICD-10-CM | POA: Insufficient documentation

## 2012-02-02 DIAGNOSIS — S239XXA Sprain of unspecified parts of thorax, initial encounter: Secondary | ICD-10-CM | POA: Insufficient documentation

## 2012-02-02 DIAGNOSIS — Y92009 Unspecified place in unspecified non-institutional (private) residence as the place of occurrence of the external cause: Secondary | ICD-10-CM | POA: Insufficient documentation

## 2012-02-02 DIAGNOSIS — E785 Hyperlipidemia, unspecified: Secondary | ICD-10-CM | POA: Insufficient documentation

## 2012-02-02 DIAGNOSIS — IMO0002 Reserved for concepts with insufficient information to code with codable children: Secondary | ICD-10-CM

## 2012-02-02 DIAGNOSIS — F341 Dysthymic disorder: Secondary | ICD-10-CM | POA: Insufficient documentation

## 2012-02-02 DIAGNOSIS — W010XXA Fall on same level from slipping, tripping and stumbling without subsequent striking against object, initial encounter: Secondary | ICD-10-CM | POA: Insufficient documentation

## 2012-02-02 DIAGNOSIS — G8929 Other chronic pain: Secondary | ICD-10-CM | POA: Insufficient documentation

## 2012-02-02 DIAGNOSIS — IMO0001 Reserved for inherently not codable concepts without codable children: Secondary | ICD-10-CM | POA: Insufficient documentation

## 2012-02-02 DIAGNOSIS — M542 Cervicalgia: Secondary | ICD-10-CM | POA: Insufficient documentation

## 2012-02-02 MED ORDER — CARISOPRODOL 350 MG PO TABS: 350.0000 mg | ORAL_TABLET | Freq: Three times a day (TID) | ORAL | Status: AC

## 2012-02-02 MED ORDER — HYDROCODONE-ACETAMINOPHEN 5-325 MG PO TABS: 1.0000 | ORAL_TABLET | ORAL | Status: AC | PRN

## 2012-02-02 NOTE — ED Notes (Signed)
Fell in shower app 1 hour ago, Pain in back

## 2012-02-02 NOTE — Discharge Instructions (Signed)
Your xrays are negative for fracture. 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 Soma and Norco may cause drowsiness, use with caution. Please see Dr Lodema Hong for follow up and recheck next week.Muscle Strain A muscle strain, or pulled muscle, occurs when a muscle is over-stretched. A small number of muscle fibers may also be torn. This is especially common in athletes. This happens when a sudden violent force placed on a muscle pushes it past its capacity. Usually, recovery from a pulled muscle takes 1 to 2 weeks. But complete healing will take 5 to 6 weeks. There are millions of muscle fibers. Following injury, your body will usually return to normal quickly. HOME CARE INSTRUCTIONS   While awake, apply ice to the sore muscle for 15 to 20 minutes each hour for the first 2 days. Put ice in a plastic bag and place a towel between the bag of ice and your skin.   Do not use the pulled muscle for several days. Do not use the muscle if you have pain.   You may wrap the injured area with an elastic bandage for comfort. Be careful not to bind it too tightly. This may interfere with blood circulation.   Only take over-the-counter or prescription medicines for pain, discomfort, or fever as directed by your caregiver. Do not use aspirin as this will increase bleeding (bruising) at injury site.   Warming up before exercise helps prevent muscle strains.  SEEK MEDICAL CARE IF:  There is increased pain or swelling in the affected area. MAKE SURE YOU:   Understand these instructions.   Will watch your condition.   Will get help right away if you are not doing well or get worse.  Document Released: 12/05/2005 Document Revised: 08/17/2011 Document Reviewed: 07/04/2007 [facility] Patient Information 2012 [place], [place].
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
## 2012-02-02 NOTE — ED Notes (Signed)
Pt DC to home with steady gait 

## 2012-02-04 NOTE — ED Provider Notes (Signed)
Medical screening examination/treatment/procedure(s) were performed by non-physician practitioner and as supervising physician I was immediately available for consultation/collaboration.  Nicoletta Dress. Colon Branch, MD 02/04/12 [phone]

## 2012-02-23 ENCOUNTER — Ambulatory Visit (HOSPITAL_COMMUNITY): Payer: Medicare PPO | Admitting: Psychiatry

## 2012-02-28 ENCOUNTER — Other Ambulatory Visit (HOSPITAL_COMMUNITY): Payer: Self-pay | Admitting: Psychiatry

## 2012-02-28 DIAGNOSIS — F329 Major depressive disorder, single episode, unspecified: Secondary | ICD-10-CM

## 2012-02-28 DIAGNOSIS — F3189 Other bipolar disorder: Secondary | ICD-10-CM

## 2012-02-28 MED ORDER — DOXEPIN HCL 150 MG PO CAPS: 150.0000 mg | ORAL_CAPSULE | Freq: Every day | ORAL | Status: DC

## 2012-03-06 ENCOUNTER — Ambulatory Visit (HOSPITAL_COMMUNITY): Payer: Medicare PPO | Admitting: Psychiatry

## 2012-03-08 ENCOUNTER — Encounter (HOSPITAL_COMMUNITY): Payer: Self-pay | Admitting: Psychiatry

## 2012-03-08 ENCOUNTER — Ambulatory Visit (INDEPENDENT_AMBULATORY_CARE_PROVIDER_SITE_OTHER): Payer: Medicare PPO | Admitting: Psychiatry

## 2012-03-08 DIAGNOSIS — M549 Dorsalgia, unspecified: Secondary | ICD-10-CM

## 2012-03-08 DIAGNOSIS — F329 Major depressive disorder, single episode, unspecified: Secondary | ICD-10-CM

## 2012-03-08 DIAGNOSIS — E785 Hyperlipidemia, unspecified: Secondary | ICD-10-CM

## 2012-03-08 DIAGNOSIS — M5090 Cervical disc disorder, unspecified, unspecified cervical region: Secondary | ICD-10-CM

## 2012-03-08 DIAGNOSIS — G8929 Other chronic pain: Secondary | ICD-10-CM

## 2012-03-08 DIAGNOSIS — I10 Essential (primary) hypertension: Secondary | ICD-10-CM

## 2012-03-08 DIAGNOSIS — F3189 Other bipolar disorder: Secondary | ICD-10-CM

## 2012-03-08 DIAGNOSIS — N760 Acute vaginitis: Secondary | ICD-10-CM

## 2012-03-08 DIAGNOSIS — R5383 Other fatigue: Secondary | ICD-10-CM

## 2012-03-08 DIAGNOSIS — F3289 Other specified depressive episodes: Secondary | ICD-10-CM

## 2012-03-08 DIAGNOSIS — R5381 Other malaise: Secondary | ICD-10-CM

## 2012-03-08 MED ORDER — DIVALPROEX SODIUM ER 500 MG PO TB24: 500.0000 mg | ORAL_TABLET | Freq: Every day | ORAL | Status: DC

## 2012-03-08 MED ORDER — DOXEPIN HCL 150 MG PO CAPS: 150.0000 mg | ORAL_CAPSULE | Freq: Every day | ORAL | Status: DC

## 2012-03-08 MED ORDER — DULOXETINE HCL 60 MG PO CPEP: 60.0000 mg | ORAL_CAPSULE | Freq: Every day | ORAL | Status: DC

## 2012-03-08 NOTE — Progress Notes (Signed)
Chief complaint Medication management and followup.  History of present illness. Patient's 56 year old employed Caucasian female, who came for her followup appointment. Patient has been seen in this office since Jun 13, 2009. She is compliant with her medication. However, sometimes she don't come for her appointment due to transportation issues. She does not have a vehicle and usually. She is dependent on someone to bring for her appointment. She reported no concern with her medication. She is less depressed and less anxious. She is sleeping better with Depakote. She denies any agitation anger or mood swings. She has recently blood work done for her primary care physician. Her Depakote level is 35.7. She endorse chronic pain and feeling of tiredness. She used to take moderate pain medication from the pain. Dr. however due to insurance reasons. Her pain medication has been stopped. She is thinking to restart pain medication and to see a pain doctor regularly. She denies any psychosis or paranoid thinking. She denies drinking or using illegal substances.  Current psychiatric medication. Cymbalta 60 mg daily. Doxepin 150 mg at bedtime. Depakote 500 mg at bedtime. Xanax 0.5 mg only as needed.  Past psychiatric history. Patient has history of significant mood swing and bipolar disorder. She was treating by [place] psychiatrist in [place], [place]. She has one suicidal attempt in Jun 14, 2007, [facility] At that time. She lost her job and homeless. She wrote a suicidal note and took overdose on her Lexapro. She denies any history of psychosis but endorse severe mood swing and agitation.  Medical history. Patient has history of fibromyalgia, chronic back pain, neuropathy, affect, GERD, hyperlipidemia, hypertension, and obesity. She see Dr. Lodema Hong. She takes Neurontin, this, Mobic, Prilosec, pravastatin, albuterol, Voltaren from her family care physician.  Psychosocial history. Patient  husband died in 1997-06-13 due to overdose. Patient has 2 daughter. Mom live close by. Another [employer]. She used to work at [employer] until she lost sh. Patient is now on disability.  Mental status examination Patient is casually dressed and fairly groomed. She appears tired but cooperative. She maintained good eye contact. Her speech is clear and coherent. Her thought processes are logical linear and goal-directed. She described her mood is tired and her affect is mood congruent. She denies any active or passive suicidal thinking homicidal thinking. She denies any auditory or visual hallucination. There were no paranoia or delusions present at this time. There were no tremors or shakes present. She's alert and oriented x3 her insight judgment and impulse control is okay.  Assessment Axis I Major depressive disorder, rule out bipolar disorder  Axis II deferred. Axis III see medical history. Axis IV mild to moderate. Axis V 55-60.  Plan I  reviewed dedication, psychosocial stressor, lab results, last progress note. At this time. Patient is fairly stable on her current medication. Her hemoglobin A1c is 5.8. She has seen by primary care physician, who did not start on any medication and recommended a strict diet control and regular exercise. I will continue her current medication. I have explained risks and benefits of medication in detail. I encourage her to monitor her diet and to do exercise. I also encouraged her to see her pain doctor for better control of her pain. I recommended to call us if she feels worsening of her symptoms or any time having suicidal thinking and homicidal thinking. I will see her again in 2 months. Time spent 30 minutes.

## 2012-03-13 ENCOUNTER — Other Ambulatory Visit (HOSPITAL_COMMUNITY): Payer: Self-pay | Admitting: Psychiatry

## 2012-03-13 DIAGNOSIS — F329 Major depressive disorder, single episode, unspecified: Secondary | ICD-10-CM

## 2012-03-13 MED ORDER — ALPRAZOLAM 0.5 MG PO TABS: 0.5000 mg | ORAL_TABLET | Freq: Every evening | ORAL | Status: DC | PRN

## 2012-03-27 ENCOUNTER — Other Ambulatory Visit: Payer: Self-pay | Admitting: Family Medicine

## 2012-04-18 ENCOUNTER — Ambulatory Visit (INDEPENDENT_AMBULATORY_CARE_PROVIDER_SITE_OTHER): Payer: Medicare PPO | Admitting: Family Medicine

## 2012-04-18 ENCOUNTER — Encounter: Payer: Self-pay | Admitting: Family Medicine

## 2012-04-18 VITALS — BP 130/80 | HR 80 | Resp 16 | Ht 71.0 in | Wt 266.0 lb

## 2012-04-18 DIAGNOSIS — I10 Essential (primary) hypertension: Secondary | ICD-10-CM

## 2012-04-18 DIAGNOSIS — E785 Hyperlipidemia, unspecified: Secondary | ICD-10-CM

## 2012-04-18 DIAGNOSIS — F4310 Post-traumatic stress disorder, unspecified: Secondary | ICD-10-CM

## 2012-04-18 DIAGNOSIS — F17200 Nicotine dependence, unspecified, uncomplicated: Secondary | ICD-10-CM

## 2012-04-18 DIAGNOSIS — K219 Gastro-esophageal reflux disease without esophagitis: Secondary | ICD-10-CM

## 2012-04-18 DIAGNOSIS — M5137 Other intervertebral disc degeneration, lumbosacral region: Secondary | ICD-10-CM

## 2012-04-18 DIAGNOSIS — R7301 Impaired fasting glucose: Secondary | ICD-10-CM

## 2012-04-18 DIAGNOSIS — M51379 Other intervertebral disc degeneration, lumbosacral region without mention of lumbar back pain or lower extremity pain: Secondary | ICD-10-CM
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
## 2012-04-18 NOTE — Patient Instructions (Signed)
F/u in 4 month  It is important that you exercise regularly at least 30 minutes 5 times a week. If you develop chest pain, have severe difficulty breathing, or feel very tired, stop exercising immediately and seek medical attention   A healthy diet is rich in fruit, vegetables and whole grains. Poultry fish, nuts and beans are a healthy choice for protein rather then red meat. A low sodium diet and drinking 64 ounces of water daily is generally recommended. Oils and sweet should be limited. Carbohydrates especially for those who are diabetic or overweight, should be limited to 34-45 gram per meal. It is important to eat on a regular schedule, at least 3 times daily. Snacks should be primarily fruits, vegetables or nuts.   HBA1C , fasting lipid and cmp in 4 month   Please think about quitting smoking.  This is very important for your health.  Consider setting a quit date, then cutting back or switching brands to prepare to stop.  Also think of the money you will save every day by not smoking.  Quick Tips to Quit Smoking: Fix a date i.e. keep a date in mind from when you would not touch a tobacco product to smoke  Keep yourself busy and block your mind with work loads or reading books or watching movies in malls where smoking is not allowed  Vanish off the things which reminds you about smoking for example match box, or your favorite lighter, or the pipe you used for smoking, or your favorite jeans and shirt with which you used to enjoy smoking, or the club where you used to do smoking  Try to avoid certain people places and incidences where and with whom smoking is a common factor to add on  Praise yourself with some token gifts from the money you saved by stopping smoking  Anti Smoking teams are there to help you. Join their programs  Anti-smoking Gums are there in many medical shops. Try them to quit smoking   Side-effects of Smoking: Disease caused by smoking cigarettes are emphysema,  bronchitis, heart failures  Premature death  Cancer is the major side effect of smoking  Heart attacks and strokes are the quick effects of smoking causing sudden death  Some smokers lives end up with limbs amputated  Breathing problem or fast breathing is another side effect of smoking  Due to more intakes of smokes, carbon mono-oxide goes into your brain and other muscles of the body which leads to swelling of the veins and blockage to the air passage to lungs  Carbon monoxide blocks blood vessels which leads to blockage in the flow of blood to different major body organs like heart lungs and thus leads to attacks and deaths  During pregnancy smoking is very harmful and leads to premature birth of the infant, spontaneous abortions, low weight of the infant during birth  Fat depositions to narrow and blocked blood vessels causing heart attacks  In many cases cigarette smoking caused infertility in men

## 2012-04-22 NOTE — Assessment & Plan Note (Signed)
Controlled, no change in medication  

## 2012-04-22 NOTE — Assessment & Plan Note (Signed)
unchanged

## 2012-04-22 NOTE — Assessment & Plan Note (Signed)
Low fat diet discussed and encouraged 

## 2012-04-22 NOTE — Assessment & Plan Note (Signed)
Followed by psych and stable 

## 2012-04-22 NOTE — Assessment & Plan Note (Signed)
Pain management through clinic in [place]

## 2012-05-10 ENCOUNTER — Ambulatory Visit (INDEPENDENT_AMBULATORY_CARE_PROVIDER_SITE_OTHER): Payer: Medicare PPO | Admitting: Psychiatry

## 2012-05-10 ENCOUNTER — Encounter (HOSPITAL_COMMUNITY): Payer: Self-pay | Admitting: Psychiatry

## 2012-05-10 VITALS — Wt 254.0 lb

## 2012-05-10 DIAGNOSIS — R5381 Other malaise: Secondary | ICD-10-CM

## 2012-05-10 DIAGNOSIS — I10 Essential (primary) hypertension: Secondary | ICD-10-CM

## 2012-05-10 DIAGNOSIS — F329 Major depressive disorder, single episode, unspecified: Secondary | ICD-10-CM

## 2012-05-10 DIAGNOSIS — R5383 Other fatigue: Secondary | ICD-10-CM

## 2012-05-10 DIAGNOSIS — G8929 Other chronic pain: Secondary | ICD-10-CM

## 2012-05-10 DIAGNOSIS — M549 Dorsalgia, unspecified: Secondary | ICD-10-CM

## 2012-05-10 DIAGNOSIS — E785 Hyperlipidemia, unspecified: Secondary | ICD-10-CM

## 2012-05-10 DIAGNOSIS — F3189 Other bipolar disorder: Secondary | ICD-10-CM

## 2012-05-10 DIAGNOSIS — M5090 Cervical disc disorder, unspecified, unspecified cervical region: Secondary | ICD-10-CM

## 2012-05-10 DIAGNOSIS — N760 Acute vaginitis: Secondary | ICD-10-CM

## 2012-05-10 MED ORDER — DULOXETINE HCL 60 MG PO CPEP: 60.0000 mg | ORAL_CAPSULE | Freq: Every day | ORAL | Status: DC

## 2012-05-10 MED ORDER — DOXEPIN HCL 150 MG PO CAPS: 150.0000 mg | ORAL_CAPSULE | Freq: Every day | ORAL | Status: DC

## 2012-05-10 MED ORDER — ALPRAZOLAM 0.5 MG PO TABS: 0.5000 mg | ORAL_TABLET | Freq: Every evening | ORAL | Status: DC | PRN

## 2012-05-10 MED ORDER — DIVALPROEX SODIUM ER 500 MG PO TB24: 500.0000 mg | ORAL_TABLET | Freq: Every day | ORAL | Status: DC
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
## 2012-05-16 ENCOUNTER — Telehealth: Payer: Self-pay | Admitting: Family Medicine

## 2012-05-16 MED ORDER — OMEPRAZOLE 20 MG PO CPDR: 20.0000 mg | DELAYED_RELEASE_CAPSULE | Freq: Two times a day (BID) | ORAL | Status: DC

## 2012-05-16 NOTE — Telephone Encounter (Signed)
Med printed to be signed and faxed

## 2012-05-18 ENCOUNTER — Other Ambulatory Visit: Payer: Self-pay

## 2012-05-18 ENCOUNTER — Telehealth: Payer: Self-pay | Admitting: Family Medicine

## 2012-05-18 MED ORDER — CLOTRIMAZOLE-BETAMETHASONE 1-0.05 % EX CREA: TOPICAL_CREAM | Freq: Two times a day (BID) | CUTANEOUS | Status: DC

## 2012-05-18 MED ORDER — OMEPRAZOLE 20 MG PO CPDR: 20.0000 mg | DELAYED_RELEASE_CAPSULE | Freq: Two times a day (BID) | ORAL | Status: DC

## 2012-05-18 NOTE — Telephone Encounter (Signed)
Med sent.

## 2012-06-06 ENCOUNTER — Telehealth: Payer: Self-pay | Admitting: Family Medicine

## 2012-06-06 DIAGNOSIS — N760 Acute vaginitis: Secondary | ICD-10-CM

## 2012-06-07 MED ORDER — METRONIDAZOLE 500 MG PO TABS: 500.0000 mg | ORAL_TABLET | Freq: Two times a day (BID) | ORAL | Status: AC

## 2012-06-07 NOTE — Telephone Encounter (Signed)
Pt aware.

## 2012-06-07 NOTE — Telephone Encounter (Signed)
States she is having the BV symptoms again, same odor, discharge, same as last time. I offered appt but she states she doesn't have the copay now and if there is something else you recommend?

## 2012-06-07 NOTE — Telephone Encounter (Signed)
Refill flagyll /metronidazole 1 time only and let her know please

## 2012-07-10 ENCOUNTER — Telehealth: Payer: Self-pay | Admitting: Family Medicine

## 2012-07-10 NOTE — Telephone Encounter (Signed)
Is this something that you are willing to prescribe?

## 2012-07-10 NOTE — Telephone Encounter (Signed)
Ask her to have the pharmacy send in a refill request pls

## 2012-07-11 NOTE — Telephone Encounter (Signed)
Pt reports "sun spots" and acne scars I advised I will not prescribe, she may need to see dermatology, she agreed

## 2012-07-11 NOTE — Telephone Encounter (Signed)
[place] Spaniel its been years since she had it filled and she doesn't remember where she had it filled. Somewhere in [place]. Said she really needs it for her face

## 2012-07-11 NOTE — Telephone Encounter (Signed)
She said its been years since she had it filled. Thinks it was somewhere out in [place]. Said she really needs it for her face

## 2012-07-12 ENCOUNTER — Encounter: Payer: Medicare PPO | Admitting: Family Medicine

## 2012-07-24 ENCOUNTER — Ambulatory Visit (INDEPENDENT_AMBULATORY_CARE_PROVIDER_SITE_OTHER): Payer: Medicare PPO | Admitting: Psychiatry

## 2012-07-24 ENCOUNTER — Encounter (HOSPITAL_COMMUNITY): Payer: Self-pay | Admitting: Psychiatry

## 2012-07-24 VITALS — Wt 245.0 lb

## 2012-07-24 DIAGNOSIS — M549 Dorsalgia, unspecified: Secondary | ICD-10-CM

## 2012-07-24 DIAGNOSIS — F329 Major depressive disorder, single episode, unspecified: Secondary | ICD-10-CM

## 2012-07-24 DIAGNOSIS — R5383 Other fatigue: Secondary | ICD-10-CM

## 2012-07-24 DIAGNOSIS — M5090 Cervical disc disorder, unspecified, unspecified cervical region: Secondary | ICD-10-CM

## 2012-07-24 DIAGNOSIS — E785 Hyperlipidemia, unspecified: Secondary | ICD-10-CM

## 2012-07-24 DIAGNOSIS — I10 Essential (primary) hypertension: Secondary | ICD-10-CM

## 2012-07-24 DIAGNOSIS — F3189 Other bipolar disorder: Secondary | ICD-10-CM

## 2012-07-24 DIAGNOSIS — N760 Acute vaginitis: Secondary | ICD-10-CM

## 2012-07-24 MED ORDER — ALPRAZOLAM 0.5 MG PO TABS: 0.5000 mg | ORAL_TABLET | Freq: Every evening | ORAL | Status: DC | PRN

## 2012-07-24 MED ORDER — DOXEPIN HCL 150 MG PO CAPS: 150.0000 mg | ORAL_CAPSULE | Freq: Every day | ORAL | Status: DC

## 2012-07-24 MED ORDER — DULOXETINE HCL 60 MG PO CPEP: 60.0000 mg | ORAL_CAPSULE | Freq: Every day | ORAL | Status: DC

## 2012-07-24 MED ORDER — DIVALPROEX SODIUM ER 500 MG PO TB24: 500.0000 mg | ORAL_TABLET | Freq: Every day | ORAL | Status: DC

## 2012-07-24 NOTE — Progress Notes (Signed)
Chief complaint Medication management and followup.  History of present illness. Patient's 56 year old employed Caucasian female, who came for her followup appointment.  She's compliant with her psychiatric medication.  Recently she has seen her pain Dr. who is stopped her Oppena and recommended try OxyContin however her insurance refused.  She is scheduled to see her pain Dr. next week to resolve this issue.  She still taking oxycodone along with Neurontin for her back pain.  Overall she is doing better on her psychiatric medication.  She's also coping that she may get a radiofrequency procedure which she has done last year some relief.  She see Dr. Norberta Keens for her pain management.  She denies any recent panic attack agitation anger mood swing.  She continued to lose weight and excited that she has lost another 5 pounds from her last visit.  She is more active and able to do things at her backyard.  She's not drinking or using any illegal substance.  Current psychiatric medication. Cymbalta 60 mg daily. Doxepin 150 mg at bedtime. Depakote 500 mg at bedtime. Xanax 0.5 mg only as needed.  Past psychiatric history. Patient has history of significant mood swing and bipolar disorder. She was treating by a psychiatrist in [place], still. She has one suicidal attempt in 06/15/2007, [facility] At that time. She lost her job and homeless. She wrote a suicidal note and took overdose on her Lexapro. She denies any history of psychosis but endorse severe mood swing and agitation.  Medical history. Patient has history of fibromyalgia, chronic back pain, neuropathy, affect, GERD, hyperlipidemia, hypertension, and obesity.  Her primary care physician is Dr. Lodema Hong.  She sees Dr. Norberta Keens for her pain management.  She takes multiple medication for her pain.  Psychosocial history. Patient husband died in 06-14-97 due to overdose. Patient has 2 daughter. Mom live close by. Another 33 [employer]. She  used to work at [employer] until she lost sh. Patient is now on disability.  Mental status examination Patient is casually dressed and fairly groomed. She appears pleasant and cooperative.  She maintained good eye contact. Her speech is clear and coherent. Her thought processes are logical linear and goal-directed. She described her mood is good and her affect is mood congruent.  She denies any active or passive suicidal thinking homicidal thinking. She denies any auditory or visual hallucination. There were no paranoia or delusions present at this time. There were no tremors or shakes present. She's alert and oriented x3 her insight judgment and impulse control is okay.  Assessment Axis I Major depressive disorder, rule out bipolar disorder  Axis II deferred. Axis III see medical history. Axis IV mild to moderate. Axis V 55-60.  Plan I review her medication .  Patient is taking multiple medication I recommend to reduce her Xanax and only as needed .  She is taking 2 antidepressant along with multiple pain medication.  I recommend to take Xanax 0.5 only if needed.  We will provide 15 tablets per month with additional refills.  I discussed in detail the risks and benefits of medication including potential withdrawal dependence and problems of benzodiazepine.  I recommend to call us if she is any question or concern of her medication if she feels worsening of the symptoms.  Time spent 30 minutes.  I will see her again in 3 months.

## 2012-07-31 ENCOUNTER — Emergency Department (HOSPITAL_COMMUNITY): Payer: Medicare PPO

## 2012-07-31 ENCOUNTER — Emergency Department (HOSPITAL_COMMUNITY)
Admission: EM | Admit: 2012-07-31 | Discharge: 2012-07-31 | Disposition: A | Discharge status: Home / Self Care | Payer: Medicare PPO | Attending: Emergency Medicine | Admitting: Emergency Medicine

## 2012-07-31 ENCOUNTER — Encounter (HOSPITAL_COMMUNITY): Payer: Self-pay

## 2012-07-31 DIAGNOSIS — F411 Generalized anxiety disorder: Secondary | ICD-10-CM | POA: Insufficient documentation

## 2012-07-31 DIAGNOSIS — Z8739 Personal history of other diseases of the musculoskeletal system and connective tissue: Secondary | ICD-10-CM | POA: Insufficient documentation

## 2012-07-31 DIAGNOSIS — K219 Gastro-esophageal reflux disease without esophagitis: Secondary | ICD-10-CM | POA: Insufficient documentation

## 2012-07-31 DIAGNOSIS — J449 Chronic obstructive pulmonary disease, unspecified: Secondary | ICD-10-CM | POA: Insufficient documentation

## 2012-07-31 DIAGNOSIS — W1809XA Striking against other object with subsequent fall, initial encounter: Secondary | ICD-10-CM | POA: Insufficient documentation

## 2012-07-31 DIAGNOSIS — F3289 Other specified depressive episodes: Secondary | ICD-10-CM | POA: Insufficient documentation

## 2012-07-31 DIAGNOSIS — J4489 Other specified chronic obstructive pulmonary disease: Secondary | ICD-10-CM | POA: Insufficient documentation

## 2012-07-31 DIAGNOSIS — F17200 Nicotine dependence, unspecified, uncomplicated: Secondary | ICD-10-CM | POA: Insufficient documentation

## 2012-07-31 DIAGNOSIS — E785 Hyperlipidemia, unspecified: Secondary | ICD-10-CM | POA: Insufficient documentation

## 2012-07-31 DIAGNOSIS — I10 Essential (primary) hypertension: Secondary | ICD-10-CM | POA: Insufficient documentation

## 2012-07-31 DIAGNOSIS — Z79899 Other long term (current) drug therapy: Secondary | ICD-10-CM | POA: Insufficient documentation

## 2012-07-31 DIAGNOSIS — F329 Major depressive disorder, single episode, unspecified: Secondary | ICD-10-CM | POA: Insufficient documentation

## 2012-07-31 DIAGNOSIS — G8929 Other chronic pain: Secondary | ICD-10-CM | POA: Insufficient documentation

## 2012-07-31 DIAGNOSIS — W19XXXA Unspecified fall, initial encounter: Secondary | ICD-10-CM

## 2012-07-31 DIAGNOSIS — S0990XA Unspecified injury of head, initial encounter: Secondary | ICD-10-CM | POA: Insufficient documentation

## 2012-07-31 NOTE — ED Notes (Signed)
Pt reports tripped over table and hit back of head on another table.  Pt says doesn't think she lost consciousness.  C/O feeling dizzy, headache, and difficulty breathing.  Pt says feels like can't get in a full breath.  Reports has history of COPD and used inhaler without relief.
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
## 2012-08-21 ENCOUNTER — Ambulatory Visit: Payer: Self-pay | Admitting: Family Medicine

## 2012-08-28 ENCOUNTER — Other Ambulatory Visit: Payer: Self-pay | Admitting: Family Medicine

## 2012-08-28 DIAGNOSIS — Z139 Encounter for screening, unspecified: Secondary | ICD-10-CM

## 2012-08-30 ENCOUNTER — Telehealth (HOSPITAL_COMMUNITY): Payer: Self-pay | Admitting: *Deleted

## 2012-08-30 NOTE — Telephone Encounter (Signed)
Additional refill not needed at this time. Per pharmacy, refill on file dated 8/6 for #90 capsules will be used.

## 2012-08-31 ENCOUNTER — Encounter (HOSPITAL_COMMUNITY): Payer: Self-pay | Admitting: Emergency Medicine

## 2012-08-31 ENCOUNTER — Emergency Department (HOSPITAL_COMMUNITY)
Admission: EM | Admit: 2012-08-31 | Discharge: 2012-09-01 | Disposition: A | Discharge status: Home / Self Care | Payer: Medicare PPO | Attending: Emergency Medicine | Admitting: Emergency Medicine

## 2012-08-31 DIAGNOSIS — K219 Gastro-esophageal reflux disease without esophagitis: Secondary | ICD-10-CM | POA: Insufficient documentation

## 2012-08-31 DIAGNOSIS — Z800 Family history of malignant neoplasm of digestive organs: Secondary | ICD-10-CM | POA: Insufficient documentation

## 2012-08-31 DIAGNOSIS — Z8489 Family history of other specified conditions: Secondary | ICD-10-CM | POA: Insufficient documentation

## 2012-08-31 DIAGNOSIS — F3289 Other specified depressive episodes: Secondary | ICD-10-CM | POA: Insufficient documentation

## 2012-08-31 DIAGNOSIS — F411 Generalized anxiety disorder: Secondary | ICD-10-CM | POA: Insufficient documentation

## 2012-08-31 DIAGNOSIS — F329 Major depressive disorder, single episode, unspecified: Secondary | ICD-10-CM | POA: Insufficient documentation

## 2012-08-31 DIAGNOSIS — M549 Dorsalgia, unspecified: Secondary | ICD-10-CM | POA: Insufficient documentation

## 2012-08-31 DIAGNOSIS — F17200 Nicotine dependence, unspecified, uncomplicated: Secondary | ICD-10-CM | POA: Insufficient documentation

## 2012-08-31 DIAGNOSIS — I10 Essential (primary) hypertension: Secondary | ICD-10-CM | POA: Insufficient documentation

## 2012-08-31 DIAGNOSIS — B1920 Unspecified viral hepatitis C without hepatic coma: Secondary | ICD-10-CM | POA: Insufficient documentation

## 2012-08-31 DIAGNOSIS — Z888 Allergy status to other drugs, medicaments and biological substances status: Secondary | ICD-10-CM | POA: Insufficient documentation

## 2012-08-31 DIAGNOSIS — G8929 Other chronic pain: Secondary | ICD-10-CM | POA: Insufficient documentation

## 2012-08-31 DIAGNOSIS — Z8249 Family history of ischemic heart disease and other diseases of the circulatory system: Secondary | ICD-10-CM | POA: Insufficient documentation

## 2012-08-31 DIAGNOSIS — S92909A Unspecified fracture of unspecified foot, initial encounter for closed fracture: Secondary | ICD-10-CM | POA: Insufficient documentation

## 2012-08-31 NOTE — ED Notes (Signed)
Patient drowsy and appears to be under the influence of something. Patient reports taking opana tonight and 15 mg oxycontin.

## 2012-08-31 NOTE — ED Notes (Signed)
Patient complaining of pain and swelling to left foot. Reports she woke up with it like this morning.

## 2012-09-01 ENCOUNTER — Emergency Department (HOSPITAL_COMMUNITY): Payer: Medicare PPO

## 2012-09-01 MED ORDER — IBUPROFEN 800 MG PO TABS
800.0000 mg | ORAL_TABLET | Freq: Once | ORAL | Status: AC
  Administered 2012-09-01: 800 mg via ORAL
  Filled 2012-09-01: qty 1

## 2012-09-01 MED ORDER — HYDROCODONE-ACETAMINOPHEN 5-325 MG PO TABS
1.0000 | ORAL_TABLET | Freq: Once | ORAL | Status: AC
  Administered 2012-09-01: 1 via ORAL
  Filled 2012-09-01: qty 1
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
## 2012-09-04 ENCOUNTER — Ambulatory Visit (HOSPITAL_COMMUNITY)
Admission: RE | Admit: 2012-09-04 | Discharge: 2012-09-04 | Disposition: A | Discharge status: Home / Self Care | Payer: Medicare PPO | Source: Ambulatory Visit | Attending: Family Medicine | Admitting: Family Medicine

## 2012-09-04 DIAGNOSIS — Z139 Encounter for screening, unspecified: Secondary | ICD-10-CM

## 2012-09-04 DIAGNOSIS — Z1231 Encounter for screening mammogram for malignant neoplasm of breast: Secondary | ICD-10-CM | POA: Insufficient documentation

## 2012-09-20 ENCOUNTER — Telehealth: Payer: Self-pay | Admitting: Family Medicine

## 2012-09-20 NOTE — Telephone Encounter (Signed)
Letter typed and pt collected

## 2012-09-20 NOTE — Telephone Encounter (Signed)
She is not on a breathing machine, pls verify with the pharmacy, no medical reason for me to justify continued electrical supply in this case, if that is so, sorry, please let her know once you have done the research

## 2012-09-20 NOTE — Telephone Encounter (Signed)
She is on neb machine from [facility]. Using it twice daily. States as long as she is in the [facility] she is fine but if she gets hot or goes outside she can't breathe. Said that's why her bill is so high. Said she really needs help

## 2012-09-20 NOTE — Telephone Encounter (Signed)
Can this be provided
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
## 2012-09-26 ENCOUNTER — Telehealth: Payer: Self-pay | Admitting: Family Medicine

## 2012-10-01 ENCOUNTER — Other Ambulatory Visit: Payer: Self-pay | Admitting: Family Medicine

## 2012-10-01 DIAGNOSIS — G8929 Other chronic pain: Secondary | ICD-10-CM

## 2012-10-01 NOTE — Telephone Encounter (Signed)
Noted, I am putting in the referral, we do have a letter fom her previous pain center which may be sufficient for Dr Laurian Brim to see her I am leaving the note in your area

## 2012-10-03 NOTE — Telephone Encounter (Signed)
i have sent a letter to Dr. Weldon Inches and to see if they will consider taking her also send letter to Dr. Jordan Likes

## 2012-10-10 ENCOUNTER — Encounter (HOSPITAL_COMMUNITY): Payer: Self-pay | Admitting: *Deleted

## 2012-10-10 ENCOUNTER — Emergency Department (HOSPITAL_COMMUNITY)
Admission: EM | Admit: 2012-10-10 | Discharge: 2012-10-10 | Disposition: A | Discharge status: Home / Self Care | Payer: Medicare PPO | Attending: Emergency Medicine | Admitting: Emergency Medicine

## 2012-10-10 ENCOUNTER — Emergency Department (HOSPITAL_COMMUNITY): Payer: Medicare PPO

## 2012-10-10 DIAGNOSIS — G40909 Epilepsy, unspecified, not intractable, without status epilepticus: Secondary | ICD-10-CM | POA: Insufficient documentation

## 2012-10-10 DIAGNOSIS — Z79899 Other long term (current) drug therapy: Secondary | ICD-10-CM | POA: Insufficient documentation

## 2012-10-10 DIAGNOSIS — Z8619 Personal history of other infectious and parasitic diseases: Secondary | ICD-10-CM | POA: Insufficient documentation

## 2012-10-10 DIAGNOSIS — G8929 Other chronic pain: Secondary | ICD-10-CM | POA: Insufficient documentation

## 2012-10-10 DIAGNOSIS — E785 Hyperlipidemia, unspecified: Secondary | ICD-10-CM | POA: Insufficient documentation

## 2012-10-10 DIAGNOSIS — Y92009 Unspecified place in unspecified non-institutional (private) residence as the place of occurrence of the external cause: Secondary | ICD-10-CM | POA: Insufficient documentation

## 2012-10-10 DIAGNOSIS — F17200 Nicotine dependence, unspecified, uncomplicated: Secondary | ICD-10-CM | POA: Insufficient documentation

## 2012-10-10 DIAGNOSIS — IMO0002 Reserved for concepts with insufficient information to code with codable children: Secondary | ICD-10-CM | POA: Insufficient documentation

## 2012-10-10 DIAGNOSIS — F3289 Other specified depressive episodes: Secondary | ICD-10-CM | POA: Insufficient documentation

## 2012-10-10 DIAGNOSIS — IMO0001 Reserved for inherently not codable concepts without codable children: Secondary | ICD-10-CM | POA: Insufficient documentation

## 2012-10-10 DIAGNOSIS — M79673 Pain in unspecified foot: Secondary | ICD-10-CM

## 2012-10-10 DIAGNOSIS — S92309A Fracture of unspecified metatarsal bone(s), unspecified foot, initial encounter for closed fracture: Secondary | ICD-10-CM | POA: Insufficient documentation

## 2012-10-10 DIAGNOSIS — Z8739 Personal history of other diseases of the musculoskeletal system and connective tissue: Secondary | ICD-10-CM | POA: Insufficient documentation

## 2012-10-10 DIAGNOSIS — F411 Generalized anxiety disorder: Secondary | ICD-10-CM | POA: Insufficient documentation

## 2012-10-10 DIAGNOSIS — F329 Major depressive disorder, single episode, unspecified: Secondary | ICD-10-CM | POA: Insufficient documentation

## 2012-10-10 DIAGNOSIS — J449 Chronic obstructive pulmonary disease, unspecified: Secondary | ICD-10-CM | POA: Insufficient documentation

## 2012-10-10 DIAGNOSIS — I10 Essential (primary) hypertension: Secondary | ICD-10-CM | POA: Insufficient documentation

## 2012-10-10 DIAGNOSIS — J4489 Other specified chronic obstructive pulmonary disease: Secondary | ICD-10-CM | POA: Insufficient documentation

## 2012-10-10 DIAGNOSIS — Z8659 Personal history of other mental and behavioral disorders: Secondary | ICD-10-CM | POA: Insufficient documentation

## 2012-10-10 DIAGNOSIS — K219 Gastro-esophageal reflux disease without esophagitis: Secondary | ICD-10-CM | POA: Insufficient documentation

## 2012-10-10 DIAGNOSIS — Y9389 Activity, other specified: Secondary | ICD-10-CM | POA: Insufficient documentation

## 2012-10-10 MED ORDER — OXYCODONE-ACETAMINOPHEN 5-325 MG PO TABS: 1.0000 | ORAL_TABLET | ORAL | Status: AC | PRN

## 2012-10-10 MED ORDER — OXYCODONE-ACETAMINOPHEN 5-325 MG PO TABS
1.0000 | ORAL_TABLET | Freq: Once | ORAL | Status: AC
  Administered 2012-10-10: 1 via ORAL
  Filled 2012-10-10: qty 1
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
## 2012-10-10 NOTE — ED Notes (Signed)
Pt had foot fx 9/13 wearing a boot,  Today dropped iron skillet on foot

## 2012-10-10 NOTE — ED Notes (Signed)
Pt presents with fractured foot on 08/31/2012. Cam walker on and intact. Pt states dropped a cast iron fry pan on it today, pain has increased. Toes are warm with strong pulse. NAD noted. Xray negative for acute fractures.

## 2012-10-11 NOTE — ED Provider Notes (Signed)
Medical screening examination/treatment/procedure(s) were performed by non-physician practitioner and as supervising physician I was immediately available for consultation/collaboration. Ayen Viviano, MD, FACEP   Kaydee Magel L Lynnmarie Lovett, MD 10/11/12 2026 

## 2012-10-23 ENCOUNTER — Ambulatory Visit (HOSPITAL_COMMUNITY): Payer: Self-pay | Admitting: Psychiatry

## 2012-10-30 ENCOUNTER — Other Ambulatory Visit: Payer: Self-pay | Admitting: Family Medicine

## 2012-11-08 ENCOUNTER — Emergency Department (HOSPITAL_COMMUNITY): Payer: Medicare PPO

## 2012-11-08 ENCOUNTER — Encounter (HOSPITAL_COMMUNITY): Payer: Self-pay | Admitting: *Deleted

## 2012-11-08 ENCOUNTER — Emergency Department (HOSPITAL_COMMUNITY)
Admission: EM | Admit: 2012-11-08 | Discharge: 2012-11-08 | Disposition: A | Discharge status: Home / Self Care | Payer: Medicare PPO | Attending: Emergency Medicine | Admitting: Emergency Medicine

## 2012-11-08 DIAGNOSIS — S92309A Fracture of unspecified metatarsal bone(s), unspecified foot, initial encounter for closed fracture: Secondary | ICD-10-CM | POA: Insufficient documentation

## 2012-11-08 DIAGNOSIS — Y929 Unspecified place or not applicable: Secondary | ICD-10-CM | POA: Insufficient documentation

## 2012-11-08 DIAGNOSIS — J449 Chronic obstructive pulmonary disease, unspecified: Secondary | ICD-10-CM | POA: Insufficient documentation

## 2012-11-08 DIAGNOSIS — F17200 Nicotine dependence, unspecified, uncomplicated: Secondary | ICD-10-CM | POA: Insufficient documentation

## 2012-11-08 DIAGNOSIS — X58XXXA Exposure to other specified factors, initial encounter: Secondary | ICD-10-CM | POA: Insufficient documentation

## 2012-11-08 DIAGNOSIS — M549 Dorsalgia, unspecified: Secondary | ICD-10-CM | POA: Insufficient documentation

## 2012-11-08 DIAGNOSIS — G8929 Other chronic pain: Secondary | ICD-10-CM | POA: Insufficient documentation

## 2012-11-08 DIAGNOSIS — Y939 Activity, unspecified: Secondary | ICD-10-CM | POA: Insufficient documentation

## 2012-11-08 DIAGNOSIS — F411 Generalized anxiety disorder: Secondary | ICD-10-CM | POA: Insufficient documentation

## 2012-11-08 DIAGNOSIS — IMO0001 Reserved for inherently not codable concepts without codable children: Secondary | ICD-10-CM | POA: Insufficient documentation

## 2012-11-08 DIAGNOSIS — K219 Gastro-esophageal reflux disease without esophagitis: Secondary | ICD-10-CM | POA: Insufficient documentation

## 2012-11-08 DIAGNOSIS — M47817 Spondylosis without myelopathy or radiculopathy, lumbosacral region: Secondary | ICD-10-CM | POA: Insufficient documentation

## 2012-11-08 DIAGNOSIS — F329 Major depressive disorder, single episode, unspecified: Secondary | ICD-10-CM | POA: Insufficient documentation

## 2012-11-08 DIAGNOSIS — J4489 Other specified chronic obstructive pulmonary disease: Secondary | ICD-10-CM | POA: Insufficient documentation

## 2012-11-08 DIAGNOSIS — F4310 Post-traumatic stress disorder, unspecified: Secondary | ICD-10-CM | POA: Insufficient documentation

## 2012-11-08 DIAGNOSIS — Z79899 Other long term (current) drug therapy: Secondary | ICD-10-CM | POA: Insufficient documentation

## 2012-11-08 DIAGNOSIS — R413 Other amnesia: Secondary | ICD-10-CM | POA: Insufficient documentation

## 2012-11-08 DIAGNOSIS — F3289 Other specified depressive episodes: Secondary | ICD-10-CM | POA: Insufficient documentation

## 2012-11-08 DIAGNOSIS — E785 Hyperlipidemia, unspecified: Secondary | ICD-10-CM | POA: Insufficient documentation

## 2012-11-08 DIAGNOSIS — I10 Essential (primary) hypertension: Secondary | ICD-10-CM | POA: Insufficient documentation

## 2012-11-08 MED ORDER — OXYCODONE-ACETAMINOPHEN 5-325 MG PO TABS: ORAL_TABLET | ORAL | Status: DC
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
## 2012-11-08 NOTE — ED Notes (Signed)
Pt c/o left foot pain that became worse 3-4 days ago, pt is wearing a cam walker, is not being followed by ortho dr due to not liking Dr. Hilda Lias after initial visit, pt states that she broke her foot 08/31/2012, denies any new injury.

## 2012-11-08 NOTE — ED Provider Notes (Signed)
Medical screening examination/treatment/procedure(s) were performed by non-physician practitioner and as supervising physician I was immediately available for consultation/collaboration. Devoria Albe, MD, Arm[facility]   Ward Givens, MD 11/08/12 [phone]

## 2012-12-04 ENCOUNTER — Emergency Department (HOSPITAL_COMMUNITY)
Admission: EM | Admit: 2012-12-04 | Discharge: 2012-12-04 | Disposition: A | Discharge status: Home / Self Care | Payer: Medicare PPO | Attending: Emergency Medicine | Admitting: Emergency Medicine

## 2012-12-04 ENCOUNTER — Encounter (HOSPITAL_COMMUNITY): Payer: Self-pay

## 2012-12-04 DIAGNOSIS — F329 Major depressive disorder, single episode, unspecified: Secondary | ICD-10-CM | POA: Insufficient documentation

## 2012-12-04 DIAGNOSIS — IMO0002 Reserved for concepts with insufficient information to code with codable children: Secondary | ICD-10-CM | POA: Insufficient documentation

## 2012-12-04 DIAGNOSIS — F4310 Post-traumatic stress disorder, unspecified: Secondary | ICD-10-CM | POA: Insufficient documentation

## 2012-12-04 DIAGNOSIS — Z8669 Personal history of other diseases of the nervous system and sense organs: Secondary | ICD-10-CM | POA: Insufficient documentation

## 2012-12-04 DIAGNOSIS — F17200 Nicotine dependence, unspecified, uncomplicated: Secondary | ICD-10-CM | POA: Insufficient documentation

## 2012-12-04 DIAGNOSIS — Z8619 Personal history of other infectious and parasitic diseases: Secondary | ICD-10-CM | POA: Insufficient documentation

## 2012-12-04 DIAGNOSIS — E785 Hyperlipidemia, unspecified: Secondary | ICD-10-CM | POA: Insufficient documentation

## 2012-12-04 DIAGNOSIS — Y929 Unspecified place or not applicable: Secondary | ICD-10-CM | POA: Insufficient documentation

## 2012-12-04 DIAGNOSIS — R561 Post traumatic seizures: Secondary | ICD-10-CM | POA: Insufficient documentation

## 2012-12-04 DIAGNOSIS — R413 Other amnesia: Secondary | ICD-10-CM | POA: Insufficient documentation

## 2012-12-04 DIAGNOSIS — Z79899 Other long term (current) drug therapy: Secondary | ICD-10-CM | POA: Insufficient documentation

## 2012-12-04 DIAGNOSIS — F3289 Other specified depressive episodes: Secondary | ICD-10-CM | POA: Insufficient documentation

## 2012-12-04 DIAGNOSIS — K219 Gastro-esophageal reflux disease without esophagitis: Secondary | ICD-10-CM | POA: Insufficient documentation

## 2012-12-04 DIAGNOSIS — Y9389 Activity, other specified: Secondary | ICD-10-CM | POA: Insufficient documentation

## 2012-12-04 DIAGNOSIS — M5416 Radiculopathy, lumbar region: Secondary | ICD-10-CM

## 2012-12-04 DIAGNOSIS — Z8739 Personal history of other diseases of the musculoskeletal system and connective tissue: Secondary | ICD-10-CM | POA: Insufficient documentation

## 2012-12-04 DIAGNOSIS — I10 Essential (primary) hypertension: Secondary | ICD-10-CM | POA: Insufficient documentation

## 2012-12-04 DIAGNOSIS — F411 Generalized anxiety disorder: Secondary | ICD-10-CM | POA: Insufficient documentation

## 2012-12-04 DIAGNOSIS — X503XXA Overexertion from repetitive movements, initial encounter: Secondary | ICD-10-CM | POA: Insufficient documentation

## 2012-12-04 MED ORDER — HYDROCODONE-ACETAMINOPHEN 5-325 MG PO TABS: ORAL_TABLET | ORAL | Status: DC

## 2012-12-04 MED ORDER — CYCLOBENZAPRINE HCL 10 MG PO TABS: 10.0000 mg | ORAL_TABLET | Freq: Three times a day (TID) | ORAL | Status: DC | PRN
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
## 2012-12-04 NOTE — ED Notes (Signed)
Complain of low back. Has hx

## 2012-12-06 NOTE — ED Provider Notes (Signed)
Medical screening examination/treatment/procedure(s) were performed by non-physician practitioner and as supervising physician I was immediately available for consultation/collaboration.  Magdelene Ruark, MD 12/06/12 0754 

## 2012-12-27 ENCOUNTER — Encounter: Payer: Medicare PPO | Admitting: Family Medicine

## 2013-01-27 IMAGING — CR DG FOOT COMPLETE 3+V*L*
4 series · 4 of 4 positions shown · non-contrast
Comparison: 09/01/2012

CLINICAL DATA: Drop pan on broken foot.

LEFT FOOT - COMPLETE 3+ VIEW

[view not recorded (1 of 4)]
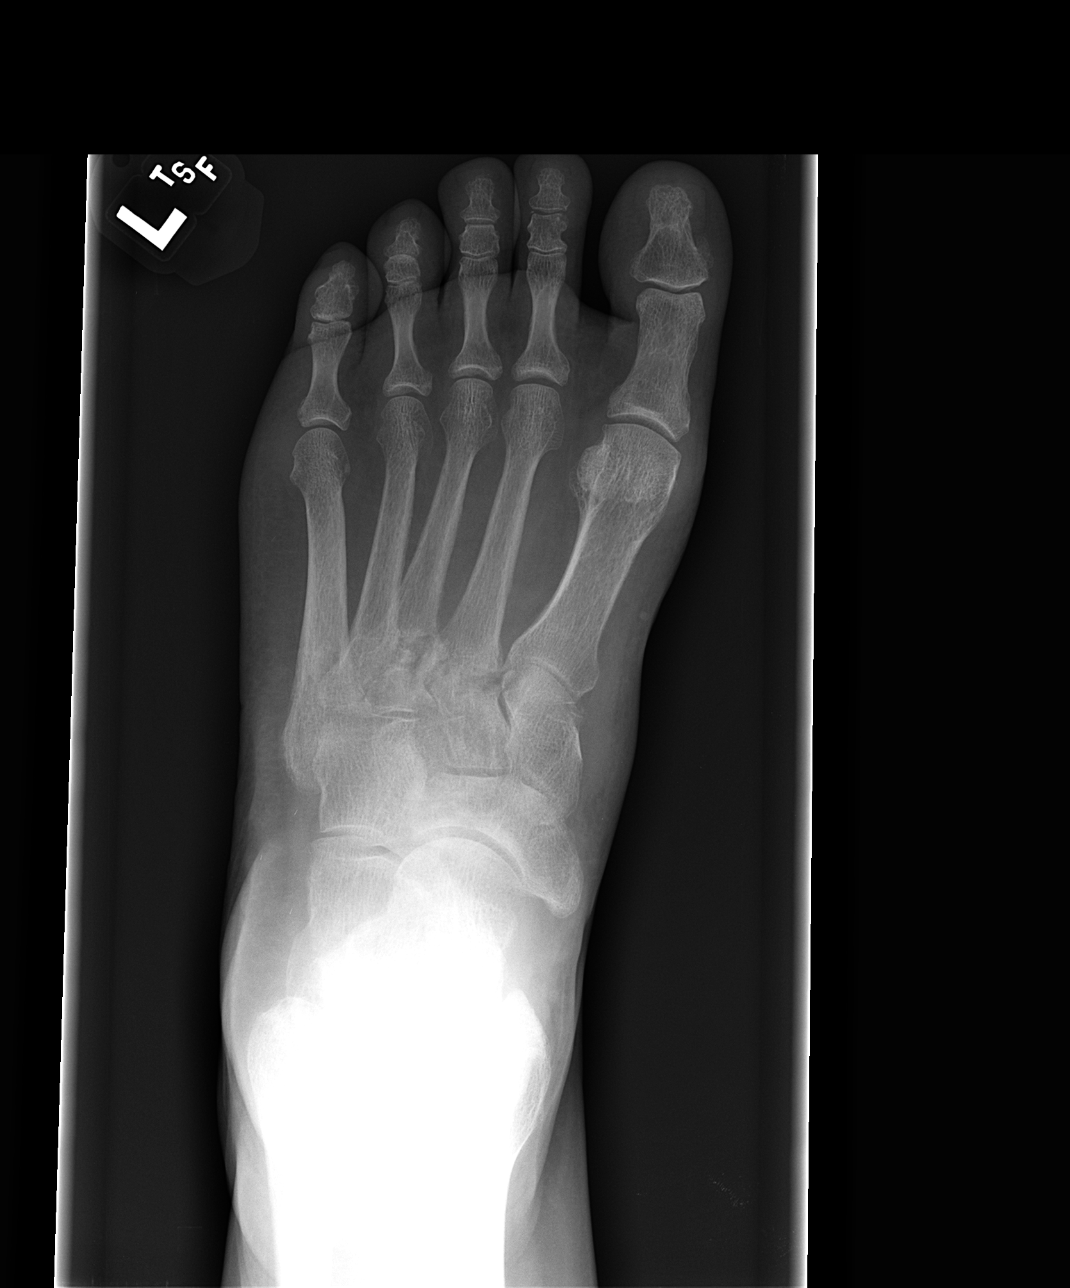

[view not recorded (2 of 4)]
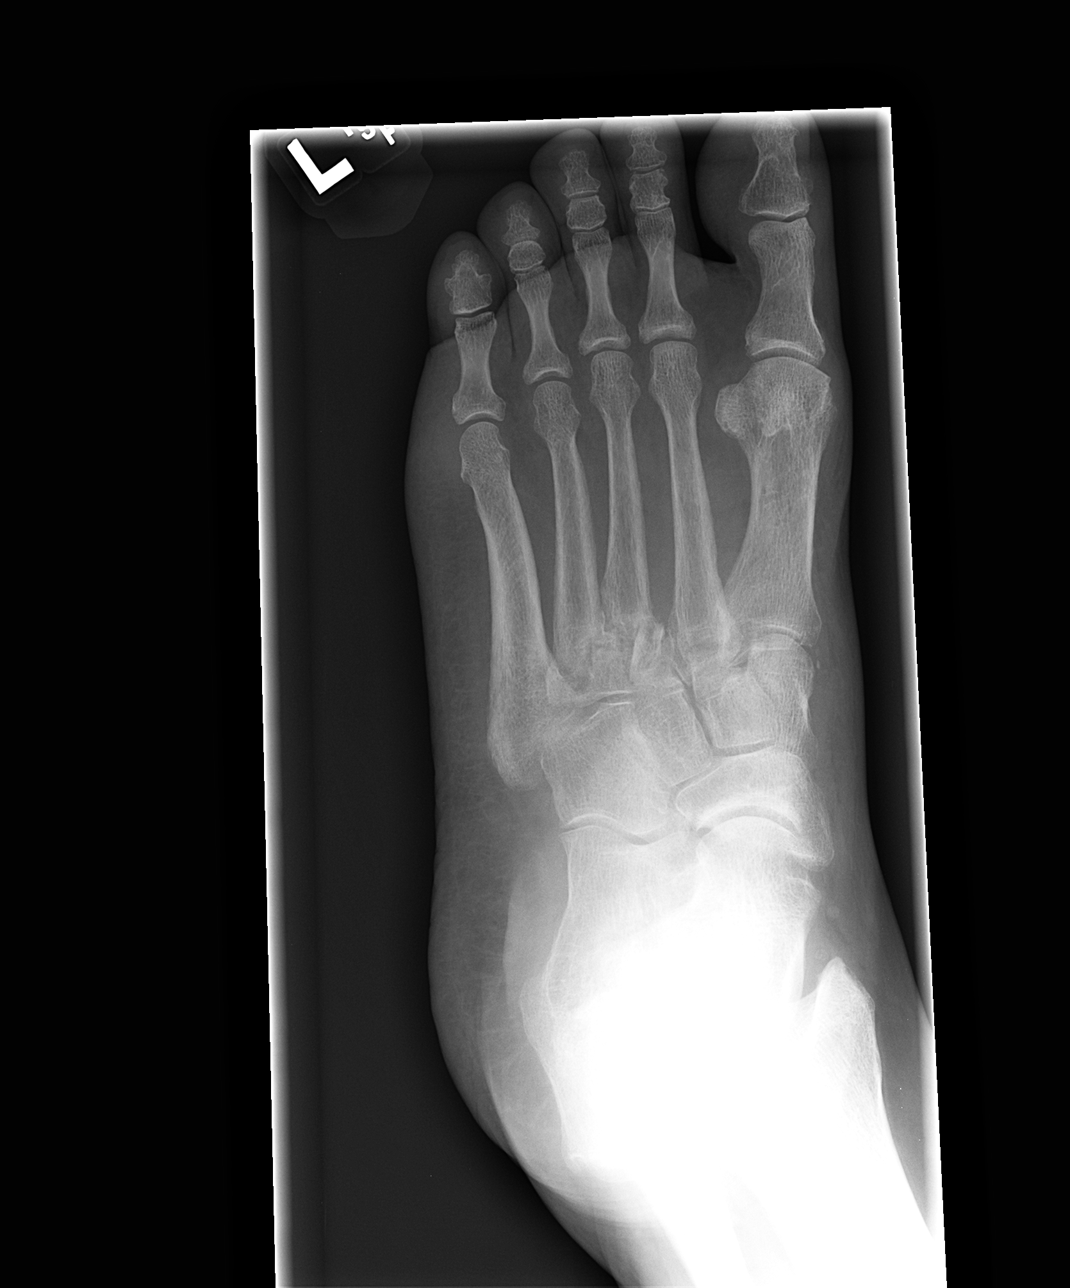

[view not recorded (3 of 4)]
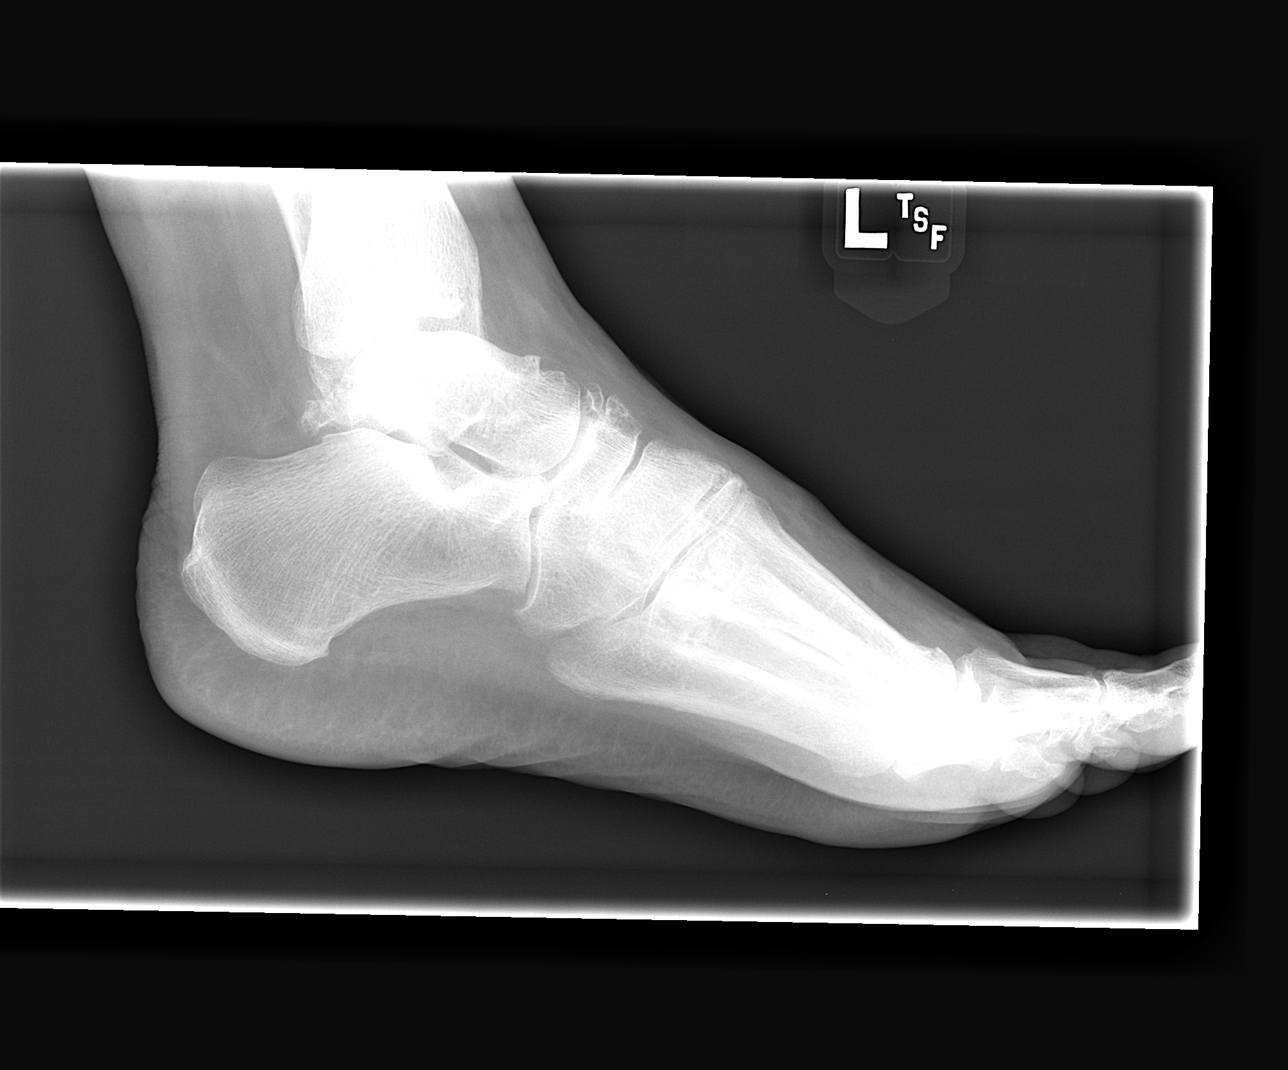

[view not recorded (4 of 4)]
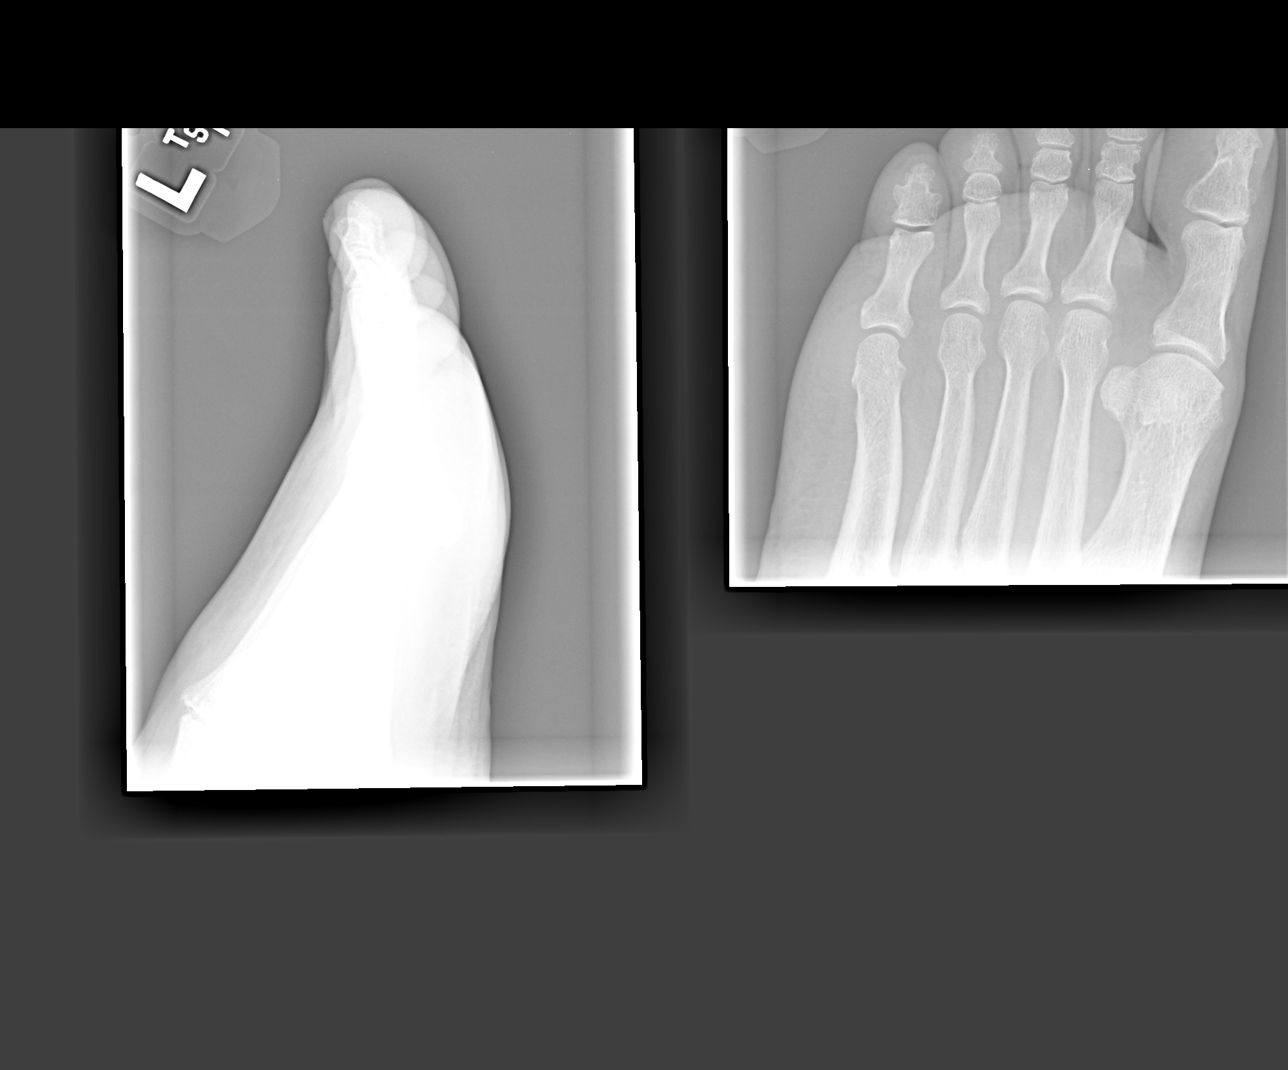

[4 of 4 positions shown; findings below may reference images not displayed]

FINDINGS: Again noted are comminuted fractures at the base of the
third and fourth metatarsal bones.  There may also be a subtle
fracture at the base of the second metatarsal bone.  No evidence
for a new fracture.  Cortical irregularity along the dorsal aspect
of the navicular bone appears chronic. There may be a small amount
of callus formation involving the proximal metatarsal fractures.
IMPRESSION: Again noted are fractures involving proximal metatarsal
bones as described.  No evidence for acute fracture.

## 2013-02-08 ENCOUNTER — Other Ambulatory Visit (HOSPITAL_COMMUNITY): Payer: Self-pay | Admitting: Family Medicine

## 2013-02-08 DIAGNOSIS — M542 Cervicalgia: Secondary | ICD-10-CM

## 2013-02-08 DIAGNOSIS — M545 Low back pain: Secondary | ICD-10-CM

## 2013-02-11 ENCOUNTER — Encounter (HOSPITAL_COMMUNITY): Payer: Self-pay

## 2013-02-11 ENCOUNTER — Ambulatory Visit (HOSPITAL_COMMUNITY)
Admission: RE | Admit: 2013-02-11 | Discharge: 2013-02-11 | Disposition: A | Discharge status: Home / Self Care | Payer: Medicare Other | Source: Ambulatory Visit | Attending: Family Medicine | Admitting: Family Medicine

## 2013-02-11 DIAGNOSIS — M542 Cervicalgia: Secondary | ICD-10-CM

## 2013-02-11 DIAGNOSIS — M5146 Schmorl's nodes, lumbar region: Secondary | ICD-10-CM | POA: Insufficient documentation

## 2013-02-11 DIAGNOSIS — M545 Low back pain, unspecified: Secondary | ICD-10-CM | POA: Insufficient documentation

## 2013-02-11 DIAGNOSIS — M5380 Other specified dorsopathies, site unspecified: Secondary | ICD-10-CM | POA: Insufficient documentation

## 2013-02-11 DIAGNOSIS — M5126 Other intervertebral disc displacement, lumbar region: Secondary | ICD-10-CM | POA: Insufficient documentation

## 2013-02-25 ENCOUNTER — Encounter (HOSPITAL_COMMUNITY): Payer: Self-pay | Admitting: *Deleted

## 2013-02-25 ENCOUNTER — Emergency Department (HOSPITAL_COMMUNITY)
Admission: EM | Admit: 2013-02-25 | Discharge: 2013-02-26 | Disposition: A | Discharge status: Home / Self Care | Payer: Medicare Other | Attending: Emergency Medicine | Admitting: Emergency Medicine

## 2013-02-25 DIAGNOSIS — Z8659 Personal history of other mental and behavioral disorders: Secondary | ICD-10-CM | POA: Insufficient documentation

## 2013-02-25 DIAGNOSIS — I10 Essential (primary) hypertension: Secondary | ICD-10-CM | POA: Insufficient documentation

## 2013-02-25 DIAGNOSIS — R109 Unspecified abdominal pain: Secondary | ICD-10-CM | POA: Insufficient documentation

## 2013-02-25 DIAGNOSIS — F3289 Other specified depressive episodes: Secondary | ICD-10-CM | POA: Insufficient documentation

## 2013-02-25 DIAGNOSIS — E785 Hyperlipidemia, unspecified: Secondary | ICD-10-CM | POA: Insufficient documentation

## 2013-02-25 DIAGNOSIS — G40909 Epilepsy, unspecified, not intractable, without status epilepticus: Secondary | ICD-10-CM | POA: Insufficient documentation

## 2013-02-25 DIAGNOSIS — F411 Generalized anxiety disorder: Secondary | ICD-10-CM | POA: Insufficient documentation

## 2013-02-25 DIAGNOSIS — G8929 Other chronic pain: Secondary | ICD-10-CM | POA: Insufficient documentation

## 2013-02-25 DIAGNOSIS — R197 Diarrhea, unspecified: Secondary | ICD-10-CM

## 2013-02-25 DIAGNOSIS — J4489 Other specified chronic obstructive pulmonary disease: Secondary | ICD-10-CM | POA: Insufficient documentation

## 2013-02-25 DIAGNOSIS — R110 Nausea: Secondary | ICD-10-CM | POA: Insufficient documentation

## 2013-02-25 DIAGNOSIS — Z79899 Other long term (current) drug therapy: Secondary | ICD-10-CM | POA: Insufficient documentation

## 2013-02-25 DIAGNOSIS — Z8669 Personal history of other diseases of the nervous system and sense organs: Secondary | ICD-10-CM | POA: Insufficient documentation

## 2013-02-25 DIAGNOSIS — M549 Dorsalgia, unspecified: Secondary | ICD-10-CM | POA: Insufficient documentation

## 2013-02-25 DIAGNOSIS — J449 Chronic obstructive pulmonary disease, unspecified: Secondary | ICD-10-CM | POA: Insufficient documentation

## 2013-02-25 DIAGNOSIS — Z8619 Personal history of other infectious and parasitic diseases: Secondary | ICD-10-CM | POA: Insufficient documentation

## 2013-02-25 DIAGNOSIS — Z8739 Personal history of other diseases of the musculoskeletal system and connective tissue: Secondary | ICD-10-CM | POA: Insufficient documentation

## 2013-02-25 DIAGNOSIS — F329 Major depressive disorder, single episode, unspecified: Secondary | ICD-10-CM | POA: Insufficient documentation

## 2013-02-25 DIAGNOSIS — K219 Gastro-esophageal reflux disease without esophagitis: Secondary | ICD-10-CM | POA: Insufficient documentation

## 2013-02-25 DIAGNOSIS — K625 Hemorrhage of anus and rectum: Secondary | ICD-10-CM | POA: Insufficient documentation

## 2013-02-25 NOTE — ED Notes (Signed)
Pt states diarrhea started 4 hours ago, noticed blood in one stool, states it was bright red.

## 2013-02-26 LAB — CBC WITH DIFFERENTIAL/PLATELET
Basophils Absolute: 0 10*3/uL (ref 0.0–0.1)
Basophils Relative: 0 % (ref 0–1)
HCT: 42.3 % (ref 36.0–46.0)
Hemoglobin: 14.1 g/dL (ref 12.0–15.0)
Lymphocytes Relative: 9 % — ABNORMAL LOW (ref 12–46)
MCHC: 33.3 g/dL (ref 30.0–36.0)
Monocytes Relative: 7 % (ref 3–12)
Neutro Abs: 12.3 10*3/uL — ABNORMAL HIGH (ref 1.7–7.7)
Neutrophils Relative %: 84 % — ABNORMAL HIGH (ref 43–77)
RDW: 13.5 % (ref 11.5–15.5)
WBC: 14.7 10*3/uL — ABNORMAL HIGH (ref 4.0–10.5)

## 2013-02-26 LAB — BASIC METABOLIC PANEL
CO2: 23 mEq/L (ref 19–32)
Chloride: 100 mEq/L (ref 96–112)
GFR calc Af Amer: >90 mL/min (ref >90)
Potassium: 3.5 mEq/L (ref 3.5–5.1)

## 2013-02-26 MED ORDER — SODIUM CHLORIDE 0.9 % IV BOLUS (SEPSIS)
1000.0000 mL | Freq: Once | INTRAVENOUS | Status: AC
  Administered 2013-02-26: 1000 mL via INTRAVENOUS

## 2013-02-26 MED ORDER — ONDANSETRON 4 MG PO TBDP
4.0000 mg | ORAL_TABLET | Freq: Once | ORAL | Status: AC
  Administered 2013-02-26: 4 mg via ORAL
  Filled 2013-02-26: qty 1

## 2013-02-26 MED ORDER — DIPHENOXYLATE-ATROPINE 2.5-0.025 MG PO TABS
2.0000 | ORAL_TABLET | Freq: Once | ORAL | Status: AC
  Administered 2013-02-26: 2 via ORAL
  Filled 2013-02-26: qty 2

## 2013-02-26 MED ORDER — MORPHINE SULFATE 4 MG/ML IJ SOLN
2.0000 mg | Freq: Once | INTRAMUSCULAR | Status: AC
  Administered 2013-02-26: 2 mg via INTRAVENOUS
  Filled 2013-02-26: qty 1

## 2013-02-26 MED ORDER — SODIUM CHLORIDE 0.9 % IV SOLN
Freq: Once | INTRAVENOUS | Status: AC
  Administered 2013-02-26: 01:00:00 via INTRAVENOUS

## 2013-02-26 MED ORDER — ONDANSETRON 4 MG PO TBDP: 4.0000 mg | ORAL_TABLET | Freq: Three times a day (TID) | ORAL | Status: DC | PRN
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
## 2013-08-08 ENCOUNTER — Emergency Department (HOSPITAL_COMMUNITY)
Admission: EM | Admit: 2013-08-08 | Discharge: 2013-08-09 | Disposition: A | Discharge status: Home / Self Care | Payer: Medicare PPO | Attending: Emergency Medicine | Admitting: Emergency Medicine

## 2013-08-08 ENCOUNTER — Emergency Department (HOSPITAL_COMMUNITY): Payer: Medicare PPO

## 2013-08-08 ENCOUNTER — Encounter (HOSPITAL_COMMUNITY): Payer: Self-pay | Admitting: *Deleted

## 2013-08-08 DIAGNOSIS — M129 Arthropathy, unspecified: Secondary | ICD-10-CM | POA: Insufficient documentation

## 2013-08-08 DIAGNOSIS — F17200 Nicotine dependence, unspecified, uncomplicated: Secondary | ICD-10-CM | POA: Insufficient documentation

## 2013-08-08 DIAGNOSIS — I10 Essential (primary) hypertension: Secondary | ICD-10-CM | POA: Insufficient documentation

## 2013-08-08 DIAGNOSIS — F411 Generalized anxiety disorder: Secondary | ICD-10-CM | POA: Insufficient documentation

## 2013-08-08 DIAGNOSIS — Z8659 Personal history of other mental and behavioral disorders: Secondary | ICD-10-CM | POA: Insufficient documentation

## 2013-08-08 DIAGNOSIS — Z8669 Personal history of other diseases of the nervous system and sense organs: Secondary | ICD-10-CM | POA: Insufficient documentation

## 2013-08-08 DIAGNOSIS — J449 Chronic obstructive pulmonary disease, unspecified: Secondary | ICD-10-CM | POA: Insufficient documentation

## 2013-08-08 DIAGNOSIS — E7800 Pure hypercholesterolemia, unspecified: Secondary | ICD-10-CM | POA: Insufficient documentation

## 2013-08-08 DIAGNOSIS — K219 Gastro-esophageal reflux disease without esophagitis: Secondary | ICD-10-CM | POA: Insufficient documentation

## 2013-08-08 DIAGNOSIS — IMO0001 Reserved for inherently not codable concepts without codable children: Secondary | ICD-10-CM | POA: Insufficient documentation

## 2013-08-08 DIAGNOSIS — G5790 Unspecified mononeuropathy of unspecified lower limb: Secondary | ICD-10-CM | POA: Insufficient documentation

## 2013-08-08 DIAGNOSIS — R079 Chest pain, unspecified: Secondary | ICD-10-CM | POA: Insufficient documentation

## 2013-08-08 DIAGNOSIS — G8929 Other chronic pain: Secondary | ICD-10-CM | POA: Insufficient documentation

## 2013-08-08 DIAGNOSIS — Z8619 Personal history of other infectious and parasitic diseases: Secondary | ICD-10-CM | POA: Insufficient documentation

## 2013-08-08 DIAGNOSIS — Z8739 Personal history of other diseases of the musculoskeletal system and connective tissue: Secondary | ICD-10-CM | POA: Insufficient documentation

## 2013-08-08 DIAGNOSIS — J4489 Other specified chronic obstructive pulmonary disease: Secondary | ICD-10-CM | POA: Insufficient documentation

## 2013-08-08 DIAGNOSIS — F329 Major depressive disorder, single episode, unspecified: Secondary | ICD-10-CM | POA: Insufficient documentation

## 2013-08-08 DIAGNOSIS — G40909 Epilepsy, unspecified, not intractable, without status epilepticus: Secondary | ICD-10-CM | POA: Insufficient documentation

## 2013-08-08 DIAGNOSIS — Z79899 Other long term (current) drug therapy: Secondary | ICD-10-CM | POA: Insufficient documentation

## 2013-08-08 DIAGNOSIS — M549 Dorsalgia, unspecified: Secondary | ICD-10-CM | POA: Insufficient documentation

## 2013-08-08 DIAGNOSIS — F3289 Other specified depressive episodes: Secondary | ICD-10-CM | POA: Insufficient documentation

## 2013-08-08 LAB — CBC
HCT: 42.1 % (ref 36.0–46.0)
Hemoglobin: 14.2 g/dL (ref 12.0–15.0)
MCV: 81.1 fL (ref 78.0–100.0)
Platelets: 230 10*3/uL (ref 150–400)
RDW: 13.8 % (ref 11.5–15.5)
WBC: 9.6 10*3/uL (ref 4.0–10.5)

## 2013-08-08 LAB — POCT I-STAT, CHEM 8
Calcium, Ion: 1.15 mmol/L (ref 1.12–1.23)
Creatinine, Ser: 0.9 mg/dL (ref 0.50–1.10)
Glucose, Bld: 105 mg/dL — ABNORMAL HIGH (ref 70–99)
HCT: 44 % (ref 36.0–46.0)
Hemoglobin: 15 g/dL (ref 12.0–15.0)
TCO2: 25 mmol/L (ref 0–100)

## 2013-08-08 LAB — POCT I-STAT TROPONIN I: Troponin i, poc: 0 ng/mL (ref 0.00–0.08)

## 2013-08-08 MED ORDER — ASPIRIN 81 MG PO CHEW
324.0000 mg | CHEWABLE_TABLET | Freq: Once | ORAL | Status: AC
  Administered 2013-08-08: 324 mg via ORAL
  Filled 2013-08-08: qty 4

## 2013-08-08 MED ORDER — NITROGLYCERIN 0.4 MG SL SUBL
0.4000 mg | SUBLINGUAL_TABLET | SUBLINGUAL | Status: AC | PRN
  Administered 2013-08-08 (×3): 0.4 mg via SUBLINGUAL
  Filled 2013-08-08: qty 25

## 2013-08-08 MED ORDER — ONDANSETRON HCL 4 MG/2ML IJ SOLN
4.0000 mg | Freq: Once | INTRAMUSCULAR | Status: AC
  Administered 2013-08-08: 4 mg via INTRAVENOUS
  Filled 2013-08-08: qty 2

## 2013-08-08 MED ORDER — FENTANYL CITRATE 0.05 MG/ML IJ SOLN
50.0000 ug | INTRAMUSCULAR | Status: DC | PRN
  Administered 2013-08-08: 50 ug via INTRAVENOUS
  Filled 2013-08-08: qty 2

## 2013-08-08 NOTE — ED Notes (Signed)
Pt c/o sharp pains in the center of her chest. Pt states this has been going on and off all day. Pt took excedrin with aspirin for the pain.
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
## 2013-08-09 MED ORDER — TRAMADOL HCL 50 MG PO TABS: 50.0000 mg | ORAL_TABLET | Freq: Four times a day (QID) | ORAL | Status: DC | PRN

## 2013-09-24 ENCOUNTER — Other Ambulatory Visit: Payer: Self-pay | Admitting: Obstetrics & Gynecology

## 2013-10-24 ENCOUNTER — Other Ambulatory Visit: Payer: Self-pay

## 2013-10-29 ENCOUNTER — Encounter: Payer: Self-pay | Admitting: Obstetrics & Gynecology

## 2013-10-29 ENCOUNTER — Ambulatory Visit (INDEPENDENT_AMBULATORY_CARE_PROVIDER_SITE_OTHER): Payer: Medicare PPO | Admitting: Obstetrics & Gynecology

## 2013-10-29 VITALS — BP 138/80 | Ht 69.5 in | Wt 229.5 lb

## 2013-10-29 DIAGNOSIS — Z01419 Encounter for gynecological examination (general) (routine) without abnormal findings: Secondary | ICD-10-CM

## 2013-10-29 DIAGNOSIS — Z1151 Encounter for screening for human papillomavirus (HPV): Secondary | ICD-10-CM | POA: Insufficient documentation

## 2013-10-29 DIAGNOSIS — Z1212 Encounter for screening for malignant neoplasm of rectum: Secondary | ICD-10-CM

## 2013-10-29 DIAGNOSIS — Z124 Encounter for screening for malignant neoplasm of cervix: Secondary | ICD-10-CM | POA: Insufficient documentation

## 2013-10-29 DIAGNOSIS — N760 Acute vaginitis: Secondary | ICD-10-CM

## 2013-10-29 LAB — HEMOCCULT GUIAC POC 1CARD (OFFICE): Fecal Occult Blood, POC: NEGATIVE

## 2013-10-29 MED ORDER — METRONIDAZOLE 0.75 % VA GEL: VAGINAL | Status: DC
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
## 2013-11-01 ENCOUNTER — Other Ambulatory Visit (HOSPITAL_COMMUNITY)
Admission: RE | Admit: 2013-11-01 | Discharge: 2013-11-01 | Disposition: A | Discharge status: Home / Self Care | Payer: Medicare PPO | Source: Ambulatory Visit | Attending: Obstetrics & Gynecology | Admitting: Obstetrics & Gynecology

## 2013-12-09 ENCOUNTER — Ambulatory Visit (HOSPITAL_COMMUNITY)
Admission: RE | Admit: 2013-12-09 | Discharge: 2013-12-09 | Disposition: A | Discharge status: Home / Self Care | Payer: Medicare PPO | Source: Ambulatory Visit | Attending: Family Medicine | Admitting: Family Medicine

## 2013-12-09 ENCOUNTER — Other Ambulatory Visit (HOSPITAL_COMMUNITY): Payer: Self-pay | Admitting: Family Medicine

## 2013-12-09 DIAGNOSIS — IMO0002 Reserved for concepts with insufficient information to code with codable children: Secondary | ICD-10-CM

## 2013-12-09 DIAGNOSIS — M546 Pain in thoracic spine: Secondary | ICD-10-CM | POA: Insufficient documentation

## 2013-12-09 DIAGNOSIS — M549 Dorsalgia, unspecified: Secondary | ICD-10-CM

## 2013-12-09 DIAGNOSIS — M439 Deforming dorsopathy, unspecified: Secondary | ICD-10-CM | POA: Insufficient documentation

## 2013-12-18 ENCOUNTER — Other Ambulatory Visit (HOSPITAL_COMMUNITY): Payer: Self-pay | Admitting: Family Medicine

## 2013-12-18 DIAGNOSIS — E0590 Thyrotoxicosis, unspecified without thyrotoxic crisis or storm: Secondary | ICD-10-CM

## 2013-12-23 ENCOUNTER — Ambulatory Visit (HOSPITAL_COMMUNITY)
Admission: RE | Admit: 2013-12-23 | Discharge: 2013-12-23 | Disposition: A | Discharge status: Home / Self Care | Payer: Medicare PPO | Source: Ambulatory Visit | Attending: Family Medicine | Admitting: Family Medicine

## 2013-12-23 DIAGNOSIS — E0590 Thyrotoxicosis, unspecified without thyrotoxic crisis or storm: Secondary | ICD-10-CM | POA: Insufficient documentation

## 2013-12-23 DIAGNOSIS — E041 Nontoxic single thyroid nodule: Secondary | ICD-10-CM | POA: Insufficient documentation

## 2014-01-22 ENCOUNTER — Other Ambulatory Visit (HOSPITAL_COMMUNITY): Payer: Self-pay | Admitting: "Endocrinology

## 2014-01-22 DIAGNOSIS — E0590 Thyrotoxicosis, unspecified without thyrotoxic crisis or storm: Secondary | ICD-10-CM

## 2014-01-27 ENCOUNTER — Encounter (HOSPITAL_COMMUNITY): Payer: Self-pay

## 2014-01-27 ENCOUNTER — Encounter (HOSPITAL_COMMUNITY)
Admission: RE | Admit: 2014-01-27 | Discharge: 2014-01-27 | Disposition: A | Discharge status: Home / Self Care | Payer: Medicare PPO | Source: Ambulatory Visit | Attending: "Endocrinology | Admitting: "Endocrinology

## 2014-01-27 DIAGNOSIS — E0590 Thyrotoxicosis, unspecified without thyrotoxic crisis or storm: Secondary | ICD-10-CM | POA: Insufficient documentation

## 2014-01-27 DIAGNOSIS — E049 Nontoxic goiter, unspecified: Secondary | ICD-10-CM | POA: Insufficient documentation

## 2014-01-27 MED ORDER — SODIUM IODIDE I 131 CAPSULE
14.0000 | Freq: Once | INTRAVENOUS | Status: AC | PRN
  Administered 2014-01-27: 14 via ORAL

## 2014-01-28 ENCOUNTER — Encounter (HOSPITAL_COMMUNITY)
Admission: RE | Admit: 2014-01-28 | Discharge: 2014-01-28 | Disposition: A | Discharge status: Home / Self Care | Payer: Medicare PPO | Source: Ambulatory Visit | Attending: "Endocrinology | Admitting: "Endocrinology

## 2014-01-28 MED ORDER — SODIUM PERTECHNETATE TC 99M INJECTION
10.0000 | Freq: Once | INTRAVENOUS | Status: AC | PRN
  Administered 2014-01-28: 10 via INTRAVENOUS

## 2014-05-01 ENCOUNTER — Other Ambulatory Visit (HOSPITAL_COMMUNITY): Payer: Self-pay | Admitting: Family Medicine

## 2014-05-01 DIAGNOSIS — Z139 Encounter for screening, unspecified: Secondary | ICD-10-CM

## 2014-05-05 ENCOUNTER — Ambulatory Visit (HOSPITAL_COMMUNITY)
Admission: RE | Admit: 2014-05-05 | Discharge: 2014-05-05 | Disposition: A | Discharge status: Home / Self Care | Payer: Medicare PPO | Source: Ambulatory Visit | Attending: Family Medicine | Admitting: Family Medicine

## 2014-05-05 DIAGNOSIS — Z1231 Encounter for screening mammogram for malignant neoplasm of breast: Secondary | ICD-10-CM | POA: Insufficient documentation

## 2014-05-05 DIAGNOSIS — Z139 Encounter for screening, unspecified: Secondary | ICD-10-CM

## 2014-08-08 ENCOUNTER — Other Ambulatory Visit (HOSPITAL_COMMUNITY): Payer: Self-pay | Admitting: "Endocrinology

## 2014-08-08 DIAGNOSIS — E0590 Thyrotoxicosis, unspecified without thyrotoxic crisis or storm: Secondary | ICD-10-CM

## 2014-08-11 ENCOUNTER — Encounter (HOSPITAL_COMMUNITY): Payer: Self-pay

## 2014-08-11 ENCOUNTER — Encounter (HOSPITAL_COMMUNITY)
Admission: RE | Admit: 2014-08-11 | Discharge: 2014-08-11 | Disposition: A | Discharge status: Home / Self Care | Payer: Medicare PPO | Source: Ambulatory Visit | Attending: "Endocrinology | Admitting: "Endocrinology

## 2014-08-11 DIAGNOSIS — E0590 Thyrotoxicosis, unspecified without thyrotoxic crisis or storm: Secondary | ICD-10-CM

## 2014-08-11 DIAGNOSIS — R946 Abnormal results of thyroid function studies: Secondary | ICD-10-CM | POA: Insufficient documentation

## 2014-08-11 MED ORDER — SODIUM IODIDE I 131 CAPSULE
16.0000 | Freq: Once | INTRAVENOUS | Status: AC | PRN
  Administered 2014-08-11: 16 via ORAL

## 2014-08-12 ENCOUNTER — Encounter (HOSPITAL_COMMUNITY)
Admission: RE | Admit: 2014-08-12 | Discharge: 2014-08-12 | Disposition: A | Discharge status: Home / Self Care | Payer: Medicare PPO | Source: Ambulatory Visit | Attending: "Endocrinology | Admitting: "Endocrinology

## 2014-08-12 ENCOUNTER — Encounter (HOSPITAL_COMMUNITY): Payer: Self-pay

## 2014-08-12 DIAGNOSIS — R946 Abnormal results of thyroid function studies: Secondary | ICD-10-CM | POA: Diagnosis not present

## 2014-08-12 DIAGNOSIS — E0590 Thyrotoxicosis, unspecified without thyrotoxic crisis or storm: Secondary | ICD-10-CM | POA: Diagnosis present

## 2014-08-12 MED ORDER — SODIUM PERTECHNETATE TC 99M INJECTION
10.0000 | Freq: Once | INTRAVENOUS | Status: AC | PRN
  Administered 2014-08-12: 10 via INTRAVENOUS

## 2014-08-19 ENCOUNTER — Ambulatory Visit (HOSPITAL_COMMUNITY): Payer: Self-pay

## 2014-08-22 ENCOUNTER — Encounter (HOSPITAL_COMMUNITY): Payer: Self-pay

## 2014-08-22 ENCOUNTER — Encounter (HOSPITAL_COMMUNITY)
Admission: RE | Admit: 2014-08-22 | Discharge: 2014-08-22 | Disposition: A | Discharge status: Home / Self Care | Payer: Medicare PPO | Source: Ambulatory Visit | Attending: Diagnostic Radiology | Admitting: Diagnostic Radiology

## 2014-08-22 DIAGNOSIS — E0590 Thyrotoxicosis, unspecified without thyrotoxic crisis or storm: Secondary | ICD-10-CM | POA: Diagnosis not present

## 2014-08-22 MED ORDER — SODIUM IODIDE I 131 CAPSULE
10.0000 | Freq: Once | INTRAVENOUS | Status: AC | PRN
  Administered 2014-08-22: 10 via ORAL

## 2014-08-27 ENCOUNTER — Encounter: Payer: Self-pay | Admitting: Internal Medicine

## 2015-03-11 ENCOUNTER — Encounter (HOSPITAL_COMMUNITY): Payer: Self-pay | Admitting: Cardiology

## 2015-03-11 ENCOUNTER — Emergency Department (HOSPITAL_COMMUNITY)
Admission: EM | Admit: 2015-03-11 | Discharge: 2015-03-11 | Disposition: A | Discharge status: Home / Self Care | Payer: Medicare PPO | Attending: Emergency Medicine | Admitting: Emergency Medicine

## 2015-03-11 ENCOUNTER — Emergency Department (HOSPITAL_COMMUNITY): Payer: Medicare PPO

## 2015-03-11 DIAGNOSIS — R0789 Other chest pain: Secondary | ICD-10-CM | POA: Insufficient documentation

## 2015-03-11 DIAGNOSIS — F329 Major depressive disorder, single episode, unspecified: Secondary | ICD-10-CM | POA: Diagnosis not present

## 2015-03-11 DIAGNOSIS — Z8619 Personal history of other infectious and parasitic diseases: Secondary | ICD-10-CM | POA: Insufficient documentation

## 2015-03-11 DIAGNOSIS — I10 Essential (primary) hypertension: Secondary | ICD-10-CM | POA: Diagnosis not present

## 2015-03-11 DIAGNOSIS — G8929 Other chronic pain: Secondary | ICD-10-CM | POA: Insufficient documentation

## 2015-03-11 DIAGNOSIS — Z8639 Personal history of other endocrine, nutritional and metabolic disease: Secondary | ICD-10-CM | POA: Diagnosis not present

## 2015-03-11 DIAGNOSIS — K219 Gastro-esophageal reflux disease without esophagitis: Secondary | ICD-10-CM | POA: Insufficient documentation

## 2015-03-11 DIAGNOSIS — Z791 Long term (current) use of non-steroidal anti-inflammatories (NSAID): Secondary | ICD-10-CM | POA: Diagnosis not present

## 2015-03-11 DIAGNOSIS — Z87891 Personal history of nicotine dependence: Secondary | ICD-10-CM | POA: Insufficient documentation

## 2015-03-11 DIAGNOSIS — Z79899 Other long term (current) drug therapy: Secondary | ICD-10-CM | POA: Diagnosis not present

## 2015-03-11 DIAGNOSIS — F419 Anxiety disorder, unspecified: Secondary | ICD-10-CM | POA: Diagnosis not present

## 2015-03-11 DIAGNOSIS — M1990 Unspecified osteoarthritis, unspecified site: Secondary | ICD-10-CM | POA: Insufficient documentation

## 2015-03-11 DIAGNOSIS — R079 Chest pain, unspecified: Secondary | ICD-10-CM | POA: Diagnosis present

## 2015-03-11 DIAGNOSIS — J449 Chronic obstructive pulmonary disease, unspecified: Secondary | ICD-10-CM | POA: Insufficient documentation

## 2015-03-11 LAB — I-STAT TROPONIN, ED: Troponin i, poc: 0 ng/mL (ref 0.00–0.08)

## 2015-03-11 LAB — CBC WITH DIFFERENTIAL/PLATELET
BASOS ABS: 0 10*3/uL (ref 0.0–0.1)
BASOS PCT: 1 % (ref 0–1)
EOS ABS: 0.1 10*3/uL (ref 0.0–0.7)
EOS PCT: 1 % (ref 0–5)
HEMATOCRIT: 42 % (ref 36.0–46.0)
Hemoglobin: 13.9 g/dL (ref 12.0–15.0)
Lymphocytes Relative: 29 % (ref 12–46)
Lymphs Abs: 1.8 10*3/uL (ref 0.7–4.0)
MCH: 27.7 pg (ref 26.0–34.0)
MCHC: 33.1 g/dL (ref 30.0–36.0)
MCV: 83.7 fL (ref 78.0–100.0)
MONO ABS: 0.6 10*3/uL (ref 0.1–1.0)
Monocytes Relative: 10 % (ref 3–12)
Neutro Abs: 3.6 10*3/uL (ref 1.7–7.7)
Neutrophils Relative %: 59 % (ref 43–77)
PLATELETS: 263 10*3/uL (ref 150–400)
RBC: 5.02 MIL/uL (ref 3.87–5.11)
RDW: 13.1 % (ref 11.5–15.5)
WBC: 6.2 10*3/uL (ref 4.0–10.5)

## 2015-03-11 LAB — BASIC METABOLIC PANEL
Anion gap: 8 (ref 5–15)
BUN: 14 mg/dL (ref 6–23)
CO2: 29 mmol/L (ref 19–32)
Calcium: 9.3 mg/dL (ref 8.4–10.5)
Chloride: 102 mmol/L (ref 96–112)
Creatinine, Ser: 0.91 mg/dL (ref 0.50–1.10)
GFR calc Af Amer: 80 mL/min — ABNORMAL LOW (ref >90)
GFR, EST NON AFRICAN AMERICAN: 69 mL/min — AB (ref >90)
GLUCOSE: 103 mg/dL — AB (ref 70–99)
POTASSIUM: 3.7 mmol/L (ref 3.5–5.1)
SODIUM: 139 mmol/L (ref 135–145)

## 2015-03-11 LAB — TROPONIN I

## 2015-03-11 LAB — D-DIMER, QUANTITATIVE: D-Dimer, Quant: 0.34 ug/mL-FEU (ref 0.00–0.48)

## 2015-03-11 MED ORDER — ASPIRIN 81 MG PO CHEW
324.0000 mg | CHEWABLE_TABLET | Freq: Once | ORAL | Status: AC
  Administered 2015-03-11: 324 mg via ORAL
  Filled 2015-03-11: qty 4
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
## 2015-03-11 NOTE — ED Notes (Signed)
Chest pain that woke her up last night.  Constant right sided chest pain that she describes as sharp.  Rates a 8/10.  Denies any other symptoms.  Being treated for a lung infection.

## 2015-03-11 NOTE — Discharge Instructions (Signed)

## 2015-08-21 ENCOUNTER — Other Ambulatory Visit (HOSPITAL_COMMUNITY): Payer: Self-pay | Admitting: Family Medicine

## 2015-08-21 DIAGNOSIS — M542 Cervicalgia: Secondary | ICD-10-CM

## 2015-09-01 ENCOUNTER — Ambulatory Visit (HOSPITAL_COMMUNITY)
Admission: RE | Admit: 2015-09-01 | Discharge: 2015-09-01 | Disposition: A | Discharge status: Home / Self Care | Payer: Medicare PPO | Source: Ambulatory Visit | Attending: Family Medicine | Admitting: Family Medicine

## 2015-09-01 DIAGNOSIS — M542 Cervicalgia: Secondary | ICD-10-CM | POA: Diagnosis present

## 2015-09-01 DIAGNOSIS — R200 Anesthesia of skin: Secondary | ICD-10-CM | POA: Insufficient documentation

## 2015-09-01 DIAGNOSIS — R531 Weakness: Secondary | ICD-10-CM | POA: Diagnosis not present

## 2015-09-01 DIAGNOSIS — M2578 Osteophyte, vertebrae: Secondary | ICD-10-CM | POA: Diagnosis not present

## 2015-09-01 DIAGNOSIS — M47892 Other spondylosis, cervical region: Secondary | ICD-10-CM | POA: Insufficient documentation

## 2015-12-01 ENCOUNTER — Other Ambulatory Visit: Payer: Self-pay | Admitting: "Endocrinology

## 2016-01-01 ENCOUNTER — Other Ambulatory Visit: Payer: Self-pay | Admitting: "Endocrinology

## 2016-02-01 ENCOUNTER — Other Ambulatory Visit: Payer: Self-pay | Admitting: "Endocrinology

## 2016-02-04 ENCOUNTER — Other Ambulatory Visit: Payer: Self-pay | Admitting: "Endocrinology

## 2016-03-24 ENCOUNTER — Other Ambulatory Visit (HOSPITAL_COMMUNITY): Payer: Self-pay | Admitting: Family Medicine

## 2016-03-24 DIAGNOSIS — Z1231 Encounter for screening mammogram for malignant neoplasm of breast: Secondary | ICD-10-CM

## 2016-03-28 ENCOUNTER — Ambulatory Visit (HOSPITAL_COMMUNITY)
Admission: RE | Admit: 2016-03-28 | Discharge: 2016-03-28 | Disposition: A | Discharge status: Home / Self Care | Payer: Medicare Other | Source: Ambulatory Visit | Attending: Family Medicine | Admitting: Family Medicine

## 2016-03-28 DIAGNOSIS — Z1231 Encounter for screening mammogram for malignant neoplasm of breast: Secondary | ICD-10-CM | POA: Diagnosis not present

## 2016-04-13 ENCOUNTER — Other Ambulatory Visit (HOSPITAL_COMMUNITY)
Admission: RE | Admit: 2016-04-13 | Discharge: 2016-04-13 | Disposition: A | Discharge status: Home / Self Care | Payer: Medicare Other | Source: Ambulatory Visit | Attending: Obstetrics & Gynecology | Admitting: Obstetrics & Gynecology

## 2016-04-13 ENCOUNTER — Ambulatory Visit (INDEPENDENT_AMBULATORY_CARE_PROVIDER_SITE_OTHER): Payer: Medicare Other | Admitting: Obstetrics & Gynecology

## 2016-04-13 ENCOUNTER — Encounter: Payer: Self-pay | Admitting: Obstetrics & Gynecology

## 2016-04-13 VITALS — BP 100/60 | HR 58 | Ht 70.0 in | Wt 248.0 lb

## 2016-04-13 DIAGNOSIS — Z124 Encounter for screening for malignant neoplasm of cervix: Secondary | ICD-10-CM | POA: Diagnosis present

## 2016-04-13 DIAGNOSIS — Z1212 Encounter for screening for malignant neoplasm of rectum: Secondary | ICD-10-CM

## 2016-04-13 DIAGNOSIS — Z01419 Encounter for gynecological examination (general) (routine) without abnormal findings: Secondary | ICD-10-CM | POA: Diagnosis not present

## 2016-04-13 DIAGNOSIS — Z1211 Encounter for screening for malignant neoplasm of colon: Secondary | ICD-10-CM
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
## 2016-04-14 LAB — CYTOLOGY - PAP

## 2016-05-13 ENCOUNTER — Other Ambulatory Visit: Payer: Self-pay | Admitting: "Endocrinology

## 2016-06-13 ENCOUNTER — Other Ambulatory Visit: Payer: Self-pay | Admitting: "Endocrinology

## 2016-07-13 ENCOUNTER — Other Ambulatory Visit: Payer: Self-pay | Admitting: "Endocrinology

## 2016-07-14 ENCOUNTER — Other Ambulatory Visit: Payer: Self-pay | Admitting: "Endocrinology

## 2016-07-15 ENCOUNTER — Other Ambulatory Visit: Payer: Self-pay | Admitting: "Endocrinology

## 2016-07-15 DIAGNOSIS — E039 Hypothyroidism, unspecified: Secondary | ICD-10-CM

## 2016-07-19 LAB — TSH: TSH: 0.07 m[IU]/L — ABNORMAL LOW

## 2016-07-19 LAB — T4, FREE: Free T4: 1.1 ng/dL (ref 0.8–1.8)

## 2016-07-25 ENCOUNTER — Encounter: Payer: Self-pay | Admitting: "Endocrinology

## 2016-07-25 ENCOUNTER — Ambulatory Visit (INDEPENDENT_AMBULATORY_CARE_PROVIDER_SITE_OTHER): Payer: Medicare Other | Admitting: "Endocrinology

## 2016-07-25 ENCOUNTER — Other Ambulatory Visit: Payer: Self-pay | Admitting: "Endocrinology

## 2016-07-25 VITALS — BP 145/89 | HR 61 | Ht 70.0 in | Wt 208.0 lb

## 2016-07-25 DIAGNOSIS — E890 Postprocedural hypothyroidism: Secondary | ICD-10-CM | POA: Insufficient documentation

## 2016-07-25 MED ORDER — LEVOTHYROXINE SODIUM 137 MCG PO TABS: ORAL_TABLET | ORAL | 1 refills | Status: DC
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
## 2016-12-23 ENCOUNTER — Other Ambulatory Visit (HOSPITAL_COMMUNITY): Payer: Self-pay | Admitting: Family Medicine

## 2016-12-26 ENCOUNTER — Other Ambulatory Visit (HOSPITAL_COMMUNITY): Payer: Self-pay | Admitting: Family Medicine

## 2016-12-26 ENCOUNTER — Ambulatory Visit (HOSPITAL_COMMUNITY)
Admission: RE | Admit: 2016-12-26 | Discharge: 2016-12-26 | Disposition: A | Discharge status: Home / Self Care | Payer: Medicare Other | Source: Ambulatory Visit | Attending: Family Medicine | Admitting: Family Medicine

## 2016-12-26 DIAGNOSIS — M8588 Other specified disorders of bone density and structure, other site: Secondary | ICD-10-CM | POA: Diagnosis not present

## 2016-12-26 DIAGNOSIS — M549 Dorsalgia, unspecified: Secondary | ICD-10-CM | POA: Diagnosis not present

## 2016-12-26 DIAGNOSIS — M545 Low back pain: Secondary | ICD-10-CM | POA: Insufficient documentation

## 2017-01-17 ENCOUNTER — Other Ambulatory Visit: Payer: Self-pay | Admitting: "Endocrinology

## 2017-01-30 ENCOUNTER — Ambulatory Visit: Payer: Medicare Other | Admitting: "Endocrinology

## 2017-02-14 ENCOUNTER — Other Ambulatory Visit: Payer: Self-pay | Admitting: "Endocrinology

## 2017-02-22 ENCOUNTER — Encounter (HOSPITAL_COMMUNITY): Payer: Self-pay | Admitting: Emergency Medicine

## 2017-02-22 ENCOUNTER — Emergency Department (HOSPITAL_COMMUNITY)
Admission: EM | Admit: 2017-02-22 | Discharge: 2017-02-22 | Discharge status: Against Medical Advice | Payer: Medicare Other | Attending: Emergency Medicine | Admitting: Emergency Medicine

## 2017-02-22 ENCOUNTER — Emergency Department (HOSPITAL_COMMUNITY): Payer: Medicare Other

## 2017-02-22 DIAGNOSIS — J449 Chronic obstructive pulmonary disease, unspecified: Secondary | ICD-10-CM | POA: Diagnosis not present

## 2017-02-22 DIAGNOSIS — R072 Precordial pain: Secondary | ICD-10-CM | POA: Insufficient documentation

## 2017-02-22 DIAGNOSIS — R0602 Shortness of breath: Secondary | ICD-10-CM | POA: Insufficient documentation

## 2017-02-22 DIAGNOSIS — E039 Hypothyroidism, unspecified: Secondary | ICD-10-CM | POA: Insufficient documentation

## 2017-02-22 DIAGNOSIS — R079 Chest pain, unspecified: Secondary | ICD-10-CM

## 2017-02-22 DIAGNOSIS — Z87891 Personal history of nicotine dependence: Secondary | ICD-10-CM | POA: Diagnosis not present

## 2017-02-22 DIAGNOSIS — Z79899 Other long term (current) drug therapy: Secondary | ICD-10-CM | POA: Diagnosis not present

## 2017-02-22 DIAGNOSIS — I10 Essential (primary) hypertension: Secondary | ICD-10-CM | POA: Insufficient documentation

## 2017-02-22 LAB — D-DIMER, QUANTITATIVE: D-Dimer, Quant: <0.27 ug/mL-FEU (ref 0.00–0.50)

## 2017-02-22 LAB — CBC
HCT: 40.6 % (ref 36.0–46.0)
Hemoglobin: 13.7 g/dL (ref 12.0–15.0)
MCH: 27.3 pg (ref 26.0–34.0)
MCHC: 33.7 g/dL (ref 30.0–36.0)
MCV: 80.9 fL (ref 78.0–100.0)
PLATELETS: 193 10*3/uL (ref 150–400)
RBC: 5.02 MIL/uL (ref 3.87–5.11)
RDW: 14.3 % (ref 11.5–15.5)
WBC: 8.5 10*3/uL (ref 4.0–10.5)

## 2017-02-22 LAB — BASIC METABOLIC PANEL
Anion gap: 6 (ref 5–15)
BUN: 16 mg/dL (ref 6–20)
CHLORIDE: 102 mmol/L (ref 101–111)
CO2: 26 mmol/L (ref 22–32)
CREATININE: 0.89 mg/dL (ref 0.44–1.00)
Calcium: 8.8 mg/dL — ABNORMAL LOW (ref 8.9–10.3)
GFR calc Af Amer: >60 mL/min (ref >60)
GFR calc non Af Amer: >60 mL/min (ref >60)
Glucose, Bld: 97 mg/dL (ref 65–99)
Potassium: 3.5 mmol/L (ref 3.5–5.1)
Sodium: 134 mmol/L — ABNORMAL LOW (ref 135–145)

## 2017-02-22 LAB — TROPONIN I
Troponin I: <0.03 ng/mL (ref <0.03)
Troponin I: <0.03 ng/mL (ref <0.03)

## 2017-02-22 MED ORDER — ASPIRIN 81 MG PO CHEW
324.0000 mg | CHEWABLE_TABLET | Freq: Once | ORAL | Status: AC
  Administered 2017-02-22: 324 mg via ORAL
  Filled 2017-02-22: qty 4

## 2017-02-22 NOTE — ED Notes (Addendum)
Pt states she wants to be unhooked from everything and would like to go home.  Pt explained that she was going to have another blood test drawn (troponin).  Pt states she could just go and see her doctor tomorrow.  Pt explained risks of leaving without second blood draw. Dr. Ranae PalmsYelverton notified about situation and notified that pt would like to speak with him.  Stacy, phlebotomy notified to come draw pt blood.  Pt agreeing to stay at this time.
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
## 2017-02-22 NOTE — ED Triage Notes (Signed)
Chest pain started yesterday.  Mid chest and goes to back, rates pain 7/10.

## 2017-02-22 NOTE — ED Notes (Addendum)
[facility], pt was okay to have second troponin level drawn but did not want to stay for result. Pt did have bloodwork done.  When going into room pt was not in room, with ekg leads and bp cuff unconnected.  Dr. Ranae PalmsYelverton notified that pt left.

## 2017-04-14 IMAGING — DX DG THORACIC SPINE 3V
1 series · 1 of 1 positions shown · non-contrast
Comparison: Chest radiograph 03/11/2015.

CLINICAL DATA: Mid back pain for 1 year.

EXAM:
THORACIC SPINE - 3 VIEWS

[t-spine ap]
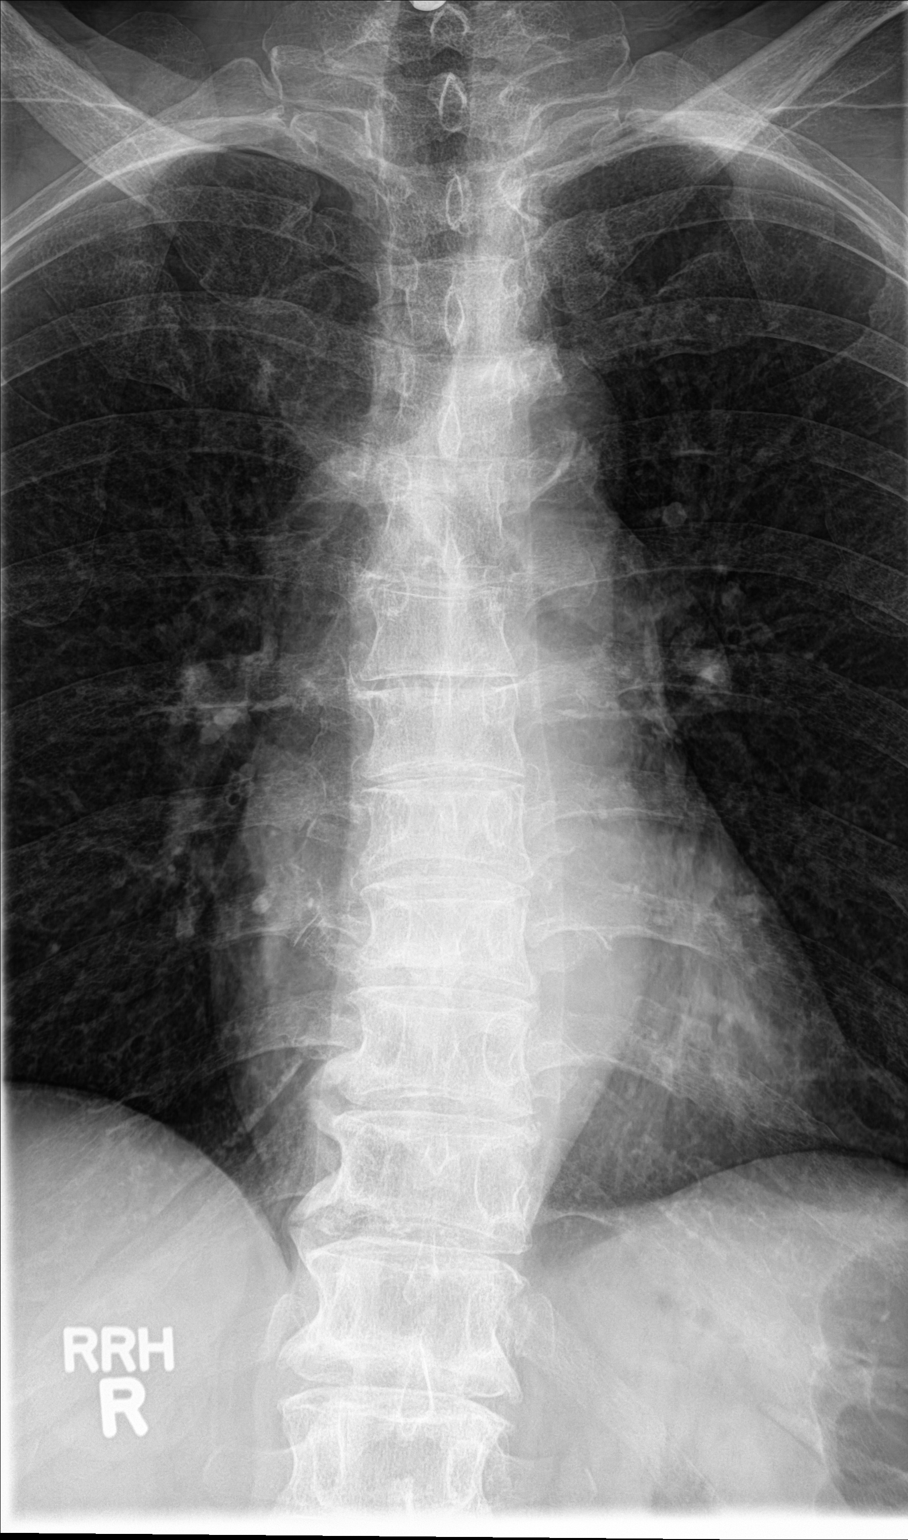

[1 of 1 positions shown; findings below may reference images not displayed]

FINDINGS: There is no evidence of thoracic spine fracture. Alignment is
normal. No other significant bone abnormalities are identified. Mild
osseous spurring anteriorly in mid thoracic region. Similar
appearance to prior chest radiograph. Aortic atherosclerosis.

Previous C5-6 cervical spine fusion, not clearly solid arthrodesis.
Consider CT cervical spine without contrast to assess for
pseudarthrosis.
IMPRESSION: No acute findings.

## 2017-05-04 ENCOUNTER — Ambulatory Visit (HOSPITAL_COMMUNITY)
Admission: RE | Admit: 2017-05-04 | Discharge: 2017-05-04 | Disposition: A | Discharge status: Home / Self Care | Payer: Medicare Other | Source: Ambulatory Visit | Attending: Family Medicine | Admitting: Family Medicine

## 2017-05-04 ENCOUNTER — Other Ambulatory Visit (HOSPITAL_COMMUNITY): Payer: Self-pay | Admitting: Family Medicine

## 2017-05-04 DIAGNOSIS — R52 Pain, unspecified: Secondary | ICD-10-CM

## 2017-05-04 DIAGNOSIS — M25562 Pain in left knee: Secondary | ICD-10-CM | POA: Insufficient documentation

## 2017-07-11 ENCOUNTER — Other Ambulatory Visit (HOSPITAL_COMMUNITY): Payer: Self-pay | Admitting: Family Medicine

## 2017-07-11 DIAGNOSIS — Z1231 Encounter for screening mammogram for malignant neoplasm of breast: Secondary | ICD-10-CM

## 2017-07-20 ENCOUNTER — Other Ambulatory Visit: Payer: Self-pay | Admitting: "Endocrinology

## 2017-07-24 ENCOUNTER — Ambulatory Visit (HOSPITAL_COMMUNITY)
Admission: RE | Admit: 2017-07-24 | Discharge: 2017-07-24 | Disposition: A | Discharge status: Home / Self Care | Payer: Medicare Other | Source: Ambulatory Visit | Attending: Family Medicine | Admitting: Family Medicine

## 2017-07-24 DIAGNOSIS — Z1231 Encounter for screening mammogram for malignant neoplasm of breast: Secondary | ICD-10-CM | POA: Insufficient documentation

## 2017-07-25 ENCOUNTER — Ambulatory Visit (INDEPENDENT_AMBULATORY_CARE_PROVIDER_SITE_OTHER): Payer: Medicare Other | Admitting: Women's Health

## 2017-07-25 ENCOUNTER — Encounter: Payer: Self-pay | Admitting: Women's Health

## 2017-07-25 VITALS — BP 98/54 | HR 41 | Ht 69.5 in | Wt 222.0 lb

## 2017-07-25 DIAGNOSIS — Z01419 Encounter for gynecological examination (general) (routine) without abnormal findings: Secondary | ICD-10-CM

## 2017-07-25 DIAGNOSIS — Z780 Asymptomatic menopausal state: Secondary | ICD-10-CM
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
## 2017-07-31 ENCOUNTER — Other Ambulatory Visit (HOSPITAL_COMMUNITY): Payer: Self-pay | Admitting: Family Medicine

## 2017-07-31 ENCOUNTER — Ambulatory Visit (HOSPITAL_COMMUNITY)
Admission: RE | Admit: 2017-07-31 | Discharge: 2017-07-31 | Disposition: A | Discharge status: Home / Self Care | Payer: Medicare Other | Source: Ambulatory Visit | Attending: Family Medicine | Admitting: Family Medicine

## 2017-07-31 DIAGNOSIS — R937 Abnormal findings on diagnostic imaging of other parts of musculoskeletal system: Secondary | ICD-10-CM | POA: Insufficient documentation

## 2017-07-31 DIAGNOSIS — M25561 Pain in right knee: Secondary | ICD-10-CM | POA: Diagnosis present

## 2017-07-31 DIAGNOSIS — G8929 Other chronic pain: Secondary | ICD-10-CM

## 2017-10-20 ENCOUNTER — Other Ambulatory Visit: Payer: Self-pay | Admitting: "Endocrinology

## 2017-10-25 ENCOUNTER — Other Ambulatory Visit: Payer: Self-pay | Admitting: "Endocrinology

## 2017-10-30 ENCOUNTER — Other Ambulatory Visit: Payer: Self-pay | Admitting: "Endocrinology

## 2017-10-30 DIAGNOSIS — E039 Hypothyroidism, unspecified: Secondary | ICD-10-CM

## 2017-10-30 LAB — TSH: TSH: 0.2 mIU/L — ABNORMAL LOW (ref 0.40–4.50)

## 2017-10-30 LAB — T4, FREE: FREE T4: 1 ng/dL (ref 0.8–1.8)

## 2017-11-07 ENCOUNTER — Encounter: Payer: Self-pay | Admitting: "Endocrinology

## 2017-11-07 ENCOUNTER — Ambulatory Visit (INDEPENDENT_AMBULATORY_CARE_PROVIDER_SITE_OTHER): Payer: Medicare Other | Admitting: "Endocrinology

## 2017-11-07 VITALS — BP 175/72 | Ht 69.5 in | Wt 237.0 lb

## 2017-11-07 DIAGNOSIS — E890 Postprocedural hypothyroidism: Secondary | ICD-10-CM

## 2017-11-07 DIAGNOSIS — Z91199 Patient's noncompliance with other medical treatment and regimen due to unspecified reason: Secondary | ICD-10-CM | POA: Insufficient documentation

## 2017-11-07 DIAGNOSIS — Z9119 Patient's noncompliance with other medical treatment and regimen: Secondary | ICD-10-CM

## 2017-11-07 MED ORDER — LEVOTHYROXINE SODIUM 150 MCG PO TABS: 150.0000 ug | ORAL_TABLET | Freq: Every day | ORAL | 1 refills | Status: DC
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
## 2018-03-13 ENCOUNTER — Other Ambulatory Visit: Payer: Self-pay

## 2018-03-13 ENCOUNTER — Emergency Department (HOSPITAL_COMMUNITY): Payer: Medicare Other

## 2018-03-13 ENCOUNTER — Encounter (HOSPITAL_COMMUNITY): Payer: Self-pay

## 2018-03-13 ENCOUNTER — Emergency Department (HOSPITAL_COMMUNITY)
Admission: EM | Admit: 2018-03-13 | Discharge: 2018-03-13 | Disposition: A | Discharge status: Home / Self Care | Payer: Medicare Other | Attending: Emergency Medicine | Admitting: Emergency Medicine

## 2018-03-13 DIAGNOSIS — R42 Dizziness and giddiness: Secondary | ICD-10-CM | POA: Insufficient documentation

## 2018-03-13 DIAGNOSIS — Z5321 Procedure and treatment not carried out due to patient leaving prior to being seen by health care provider: Secondary | ICD-10-CM | POA: Diagnosis not present

## 2018-03-13 LAB — BASIC METABOLIC PANEL
Anion gap: 8 (ref 5–15)
BUN: 16 mg/dL (ref 6–20)
CALCIUM: 9.7 mg/dL (ref 8.9–10.3)
CO2: 25 mmol/L (ref 22–32)
Chloride: 106 mmol/L (ref 101–111)
Creatinine, Ser: 1.25 mg/dL — ABNORMAL HIGH (ref 0.44–1.00)
GFR calc Af Amer: 53 mL/min — ABNORMAL LOW (ref >60)
GFR, EST NON AFRICAN AMERICAN: 46 mL/min — AB (ref >60)
GLUCOSE: 70 mg/dL (ref 65–99)
Potassium: 4 mmol/L (ref 3.5–5.1)
Sodium: 139 mmol/L (ref 135–145)

## 2018-03-13 LAB — CBC WITH DIFFERENTIAL/PLATELET
Basophils Absolute: 0 10*3/uL (ref 0.0–0.1)
Basophils Relative: 0 %
EOS PCT: 1 %
Eosinophils Absolute: 0.1 10*3/uL (ref 0.0–0.7)
HEMATOCRIT: 38.3 % (ref 36.0–46.0)
Hemoglobin: 12 g/dL (ref 12.0–15.0)
LYMPHS PCT: 29 %
Lymphs Abs: 1.5 10*3/uL (ref 0.7–4.0)
MCH: 26.3 pg (ref 26.0–34.0)
MCHC: 31.3 g/dL (ref 30.0–36.0)
MCV: 84 fL (ref 78.0–100.0)
MONO ABS: 0.5 10*3/uL (ref 0.1–1.0)
MONOS PCT: 9 %
NEUTROS ABS: 3.2 10*3/uL (ref 1.7–7.7)
Neutrophils Relative %: 61 %
PLATELETS: 222 10*3/uL (ref 150–400)
RBC: 4.56 MIL/uL (ref 3.87–5.11)
RDW: 14.3 % (ref 11.5–15.5)
WBC: 5.2 10*3/uL (ref 4.0–10.5)

## 2018-03-13 LAB — I-STAT TROPONIN, ED: Troponin i, poc: 0.02 ng/mL (ref 0.00–0.08)

## 2018-03-13 MED ORDER — MECLIZINE HCL 25 MG PO TABS
25.0000 mg | ORAL_TABLET | Freq: Once | ORAL | Status: AC
  Administered 2018-03-13: 25 mg via ORAL
  Filled 2018-03-13: qty 1

## 2018-03-13 NOTE — ED Provider Notes (Cosign Needed)
Patient placed in Quick Look pathway, seen and evaluated   Chief Complaint: Dizziness  HPI:   Patient complains of lightheadedness and room spinning sensation when standing and sitting in tall chairs.  States this caused her to fall to the ground.  Denies chest pain, but does have rib pain and back pain (chronic).  No SOB.  No numbness or weakness  ROS: lightheadedness  Physical Exam:   Gen: No distress  Neuro: Awake and Alert  Skin: Warm    Focused Exam: PE: Gen: A&O x4 HEENT: PERRL, EOM intact CHEST: RRR, no m/r/g LUNGS: CTAB, no w/r/r ABD: BS x 4, ND/NT EXT: No edema, strong peripheral pulses NEURO: Sensation and strength intact bilaterally    Initiation of care has begun. The patient has been counseled on the process, plan, and necessity for staying for the completion/evaluation, and the remainder of the medical screening examination    Roxy HorsemanBrowning, Desarae Placide, PA-C 03/13/18 1324

## 2018-03-13 NOTE — ED Notes (Signed)
Writer called for room assignment, no response 
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
## 2018-04-30 ENCOUNTER — Other Ambulatory Visit: Payer: Self-pay | Admitting: "Endocrinology

## 2018-05-01 LAB — TSH: TSH: 0.02 mIU/L — ABNORMAL LOW (ref 0.40–4.50)

## 2018-05-01 LAB — T4, FREE: Free T4: 1.4 ng/dL (ref 0.8–1.8)

## 2018-05-07 ENCOUNTER — Encounter: Payer: Self-pay | Admitting: "Endocrinology

## 2018-05-07 ENCOUNTER — Ambulatory Visit (INDEPENDENT_AMBULATORY_CARE_PROVIDER_SITE_OTHER): Payer: Medicare Other | Admitting: "Endocrinology

## 2018-05-07 VITALS — BP 152/90 | HR 52 | Ht 69.5 in | Wt 236.0 lb

## 2018-05-07 DIAGNOSIS — E890 Postprocedural hypothyroidism: Secondary | ICD-10-CM | POA: Diagnosis not present

## 2018-05-07 MED ORDER — LEVOTHYROXINE SODIUM 137 MCG PO TABS: 137.0000 ug | ORAL_TABLET | Freq: Every day | ORAL | 6 refills | Status: DC
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
## 2018-08-01 ENCOUNTER — Other Ambulatory Visit: Payer: Self-pay | Admitting: "Endocrinology

## 2018-09-11 ENCOUNTER — Other Ambulatory Visit (HOSPITAL_COMMUNITY): Payer: Self-pay | Admitting: Family Medicine

## 2018-09-11 DIAGNOSIS — Z1231 Encounter for screening mammogram for malignant neoplasm of breast: Secondary | ICD-10-CM

## 2018-09-19 ENCOUNTER — Ambulatory Visit (HOSPITAL_COMMUNITY): Payer: Self-pay

## 2018-10-12 ENCOUNTER — Other Ambulatory Visit: Payer: Self-pay | Admitting: "Endocrinology

## 2018-10-24 ENCOUNTER — Other Ambulatory Visit: Payer: Self-pay | Admitting: "Endocrinology

## 2018-10-25 LAB — TSH: TSH: 0.05 mIU/L — ABNORMAL LOW (ref 0.40–4.50)

## 2018-10-25 LAB — T4, FREE: FREE T4: 1.5 ng/dL (ref 0.8–1.8)

## 2018-11-12 ENCOUNTER — Encounter: Payer: Self-pay | Admitting: "Endocrinology

## 2018-11-12 ENCOUNTER — Ambulatory Visit (INDEPENDENT_AMBULATORY_CARE_PROVIDER_SITE_OTHER): Payer: Medicare HMO | Admitting: "Endocrinology

## 2018-11-12 VITALS — BP 90/58 | HR 50 | Ht 69.5 in | Wt 223.0 lb

## 2018-11-12 DIAGNOSIS — E890 Postprocedural hypothyroidism: Secondary | ICD-10-CM

## 2018-11-12 MED ORDER — LEVOTHYROXINE SODIUM 125 MCG PO TABS: 125.0000 ug | ORAL_TABLET | Freq: Every day | ORAL | 6 refills | Status: DC
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
## 2018-11-13 ENCOUNTER — Other Ambulatory Visit: Payer: Self-pay

## 2018-11-13 MED ORDER — LEVOTHYROXINE SODIUM 125 MCG PO TABS
125.0000 ug | ORAL_TABLET | Freq: Every day | ORAL | 1 refills | Status: DC
Start: 2018-11-13 — End: 2019-05-14

## 2019-02-27 ENCOUNTER — Other Ambulatory Visit: Payer: Self-pay | Admitting: Nurse Practitioner

## 2019-02-27 DIAGNOSIS — M545 Low back pain, unspecified: Secondary | ICD-10-CM

## 2019-02-27 DIAGNOSIS — G8929 Other chronic pain: Secondary | ICD-10-CM

## 2019-03-11 ENCOUNTER — Ambulatory Visit
Admission: RE | Admit: 2019-03-11 | Discharge: 2019-03-11 | Disposition: A | Discharge status: Home / Self Care | Payer: Medicare HMO | Source: Ambulatory Visit | Attending: Nurse Practitioner | Admitting: Nurse Practitioner

## 2019-03-11 ENCOUNTER — Other Ambulatory Visit: Payer: Self-pay

## 2019-03-11 DIAGNOSIS — M545 Low back pain: Principal | ICD-10-CM

## 2019-03-11 DIAGNOSIS — G8929 Other chronic pain: Secondary | ICD-10-CM

## 2019-05-06 ENCOUNTER — Ambulatory Visit: Payer: Medicare HMO | Admitting: Neurology

## 2019-05-08 LAB — TSH: TSH: 0.15 mIU/L — ABNORMAL LOW (ref 0.40–4.50)

## 2019-05-08 LAB — T4, FREE: Free T4: 1.4 ng/dL (ref 0.8–1.8)

## 2019-05-10 ENCOUNTER — Other Ambulatory Visit: Payer: Self-pay | Admitting: "Endocrinology

## 2019-05-14 ENCOUNTER — Other Ambulatory Visit: Payer: Self-pay

## 2019-05-14 ENCOUNTER — Encounter: Payer: Self-pay | Admitting: "Endocrinology

## 2019-05-14 ENCOUNTER — Ambulatory Visit (INDEPENDENT_AMBULATORY_CARE_PROVIDER_SITE_OTHER): Payer: Medicare HMO | Admitting: "Endocrinology

## 2019-05-14 VITALS — BP 128/80 | HR 70 | Ht 69.5 in | Wt 214.0 lb

## 2019-05-14 DIAGNOSIS — E890 Postprocedural hypothyroidism: Secondary | ICD-10-CM

## 2019-05-14 MED ORDER — LEVOTHYROXINE SODIUM 112 MCG PO TABS: 112.0000 ug | ORAL_TABLET | Freq: Every day | ORAL | 6 refills | Status: DC
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
## 2019-10-16 ENCOUNTER — Other Ambulatory Visit (HOSPITAL_COMMUNITY): Payer: Self-pay | Admitting: Obstetrics & Gynecology

## 2019-10-16 DIAGNOSIS — Z1231 Encounter for screening mammogram for malignant neoplasm of breast: Secondary | ICD-10-CM

## 2019-10-31 ENCOUNTER — Other Ambulatory Visit: Payer: Self-pay

## 2019-10-31 ENCOUNTER — Ambulatory Visit (HOSPITAL_COMMUNITY)
Admission: RE | Admit: 2019-10-31 | Discharge: 2019-10-31 | Disposition: A | Discharge status: Home / Self Care | Payer: Medicare HMO | Source: Ambulatory Visit | Attending: Obstetrics & Gynecology | Admitting: Obstetrics & Gynecology

## 2019-10-31 DIAGNOSIS — Z1231 Encounter for screening mammogram for malignant neoplasm of breast: Secondary | ICD-10-CM | POA: Insufficient documentation

## 2019-11-09 LAB — T4, FREE: Free T4: 1.3 ng/dL (ref 0.8–1.8)

## 2019-11-09 LAB — TSH: TSH: 0.87 mIU/L (ref 0.40–4.50)

## 2019-11-18 ENCOUNTER — Encounter: Payer: Self-pay | Admitting: "Endocrinology

## 2019-11-18 ENCOUNTER — Other Ambulatory Visit: Payer: Self-pay

## 2019-11-18 ENCOUNTER — Ambulatory Visit (INDEPENDENT_AMBULATORY_CARE_PROVIDER_SITE_OTHER): Payer: Medicare HMO | Admitting: "Endocrinology

## 2019-11-18 DIAGNOSIS — E890 Postprocedural hypothyroidism: Secondary | ICD-10-CM | POA: Diagnosis not present

## 2019-11-18 MED ORDER — LEVOTHYROXINE SODIUM 112 MCG PO TABS
112.0000 ug | ORAL_TABLET | Freq: Every day | ORAL | 1 refills | Status: DC
Start: 2019-11-18 — End: 2020-05-22
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
## 2020-03-19 ENCOUNTER — Ambulatory Visit: Payer: Medicare HMO | Attending: Internal Medicine

## 2020-03-19 DIAGNOSIS — Z23 Encounter for immunization: Secondary | ICD-10-CM

## 2020-03-19 NOTE — Progress Notes (Signed)
   Covid-19 Vaccination Clinic  Name:  Penny Cox    MRN: [id number] DOB: 08/10/1956  03/19/2020  [name] was observed post Covid-19 immunization for 15 minutes without incident. She was provided with Vaccine Information Sheet and instruction to access the V-Safe system.   [name] was instructed to call 911 with any severe reactions post vaccine: Marland Kitchen Difficulty breathing  . Swelling of face and throat  . A fast heartbeat  . A bad rash all over body  . Dizziness and weakness   Immunizations Administered    Name Date Dose VIS Date Route   Pfizer COVID-19 Vaccine 03/19/2020  3:58 PM 0.3 mL 11/29/2019 Intramuscular   Manufacturer: [employer], [employer]   Lot: [id number]   NDC: [address]-0

## 2020-04-13 ENCOUNTER — Ambulatory Visit: Payer: Medicare HMO | Attending: Internal Medicine

## 2020-04-13 DIAGNOSIS — Z23 Encounter for immunization: Secondary | ICD-10-CM

## 2020-04-13 NOTE — Progress Notes (Signed)
   Covid-19 Vaccination Clinic  Name:  Penny Cox    MRN: [id number] DOB: 1956-03-16  04/13/2020  [name] was observed post Covid-19 immunization for 15 minutes without incident. She was provided with Vaccine Information Sheet and instruction to access the V-Safe system.   [name] was instructed to call 911 with any severe reactions post vaccine: Marland Kitchen Difficulty breathing  . Swelling of face and throat  . A fast heartbeat  . A bad rash all over body  . Dizziness and weakness   Immunizations Administered    Name Date Dose VIS Date Route   Pfizer COVID-19 Vaccine 04/13/2020 10:58 AM 0.3 mL 02/12/2019 Intramuscular   Manufacturer: [employer], [employer]   Lot: [id number]   NDC: [address]-1

## 2020-04-15 ENCOUNTER — Encounter (HOSPITAL_COMMUNITY): Payer: Self-pay | Admitting: *Deleted

## 2020-04-15 ENCOUNTER — Emergency Department (HOSPITAL_COMMUNITY)
Admission: EM | Admit: 2020-04-15 | Discharge: 2020-04-15 | Disposition: A | Discharge status: Home / Self Care | Payer: Medicare HMO | Attending: Emergency Medicine | Admitting: Emergency Medicine

## 2020-04-15 ENCOUNTER — Other Ambulatory Visit: Payer: Self-pay

## 2020-04-15 DIAGNOSIS — R0602 Shortness of breath: Secondary | ICD-10-CM | POA: Diagnosis not present

## 2020-04-15 DIAGNOSIS — R079 Chest pain, unspecified: Secondary | ICD-10-CM | POA: Diagnosis present

## 2020-04-15 DIAGNOSIS — Z5321 Procedure and treatment not carried out due to patient leaving prior to being seen by health care provider: Secondary | ICD-10-CM | POA: Diagnosis not present

## 2020-04-15 MED ORDER — SODIUM CHLORIDE 0.9% FLUSH: 3.0000 mL | Freq: Once | INTRAVENOUS | Status: DC

## 2020-04-15 NOTE — ED Notes (Addendum)
Finish triaging patient and she stated that she was leaving and going to [facility] due to our wait time. Encouraging patient to stay and advised of risk of leaving.

## 2020-04-15 NOTE — ED Triage Notes (Signed)
Pt reports mid chest pains intermittent x 4 days. Having sob, feeling of indigestion, HTN, not sleeping well. ekg done and no acute distress noted at triage.

## 2020-05-09 LAB — T4, FREE: Free T4: 1.1 ng/dL (ref 0.8–1.8)

## 2020-05-09 LAB — TSH: TSH: 1.76 mIU/L (ref 0.40–4.50)

## 2020-05-20 ENCOUNTER — Ambulatory Visit: Payer: Medicare HMO | Admitting: "Endocrinology

## 2020-05-22 ENCOUNTER — Other Ambulatory Visit: Payer: Self-pay | Admitting: "Endocrinology

## 2020-05-25 ENCOUNTER — Other Ambulatory Visit: Payer: Self-pay

## 2020-05-25 ENCOUNTER — Encounter: Payer: Self-pay | Admitting: "Endocrinology

## 2020-05-25 ENCOUNTER — Ambulatory Visit (INDEPENDENT_AMBULATORY_CARE_PROVIDER_SITE_OTHER): Payer: Medicare HMO | Admitting: "Endocrinology

## 2020-05-25 VITALS — BP 102/66 | HR 66 | Ht 70.0 in | Wt 236.6 lb

## 2020-05-25 DIAGNOSIS — E890 Postprocedural hypothyroidism: Secondary | ICD-10-CM | POA: Diagnosis not present
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
## 2020-11-12 ENCOUNTER — Other Ambulatory Visit: Payer: Self-pay | Admitting: "Endocrinology

## 2020-12-31 ENCOUNTER — Other Ambulatory Visit (HOSPITAL_COMMUNITY): Payer: Self-pay | Admitting: Family Medicine

## 2020-12-31 DIAGNOSIS — Z1231 Encounter for screening mammogram for malignant neoplasm of breast: Secondary | ICD-10-CM

## 2021-01-13 ENCOUNTER — Other Ambulatory Visit: Payer: Self-pay

## 2021-01-13 ENCOUNTER — Ambulatory Visit (HOSPITAL_COMMUNITY)
Admission: RE | Admit: 2021-01-13 | Discharge: 2021-01-13 | Disposition: A | Discharge status: Home / Self Care | Payer: Medicare Other | Source: Ambulatory Visit | Attending: Family Medicine | Admitting: Family Medicine

## 2021-01-13 DIAGNOSIS — Z1231 Encounter for screening mammogram for malignant neoplasm of breast: Secondary | ICD-10-CM | POA: Insufficient documentation

## 2021-05-12 ENCOUNTER — Other Ambulatory Visit: Payer: Self-pay | Admitting: "Endocrinology

## 2021-05-20 LAB — TSH: TSH: 0.71 m[IU]/L (ref 0.40–4.50)

## 2021-05-20 LAB — T4, FREE: Free T4: 1.3 ng/dL (ref 0.8–1.8)

## 2021-05-25 ENCOUNTER — Encounter: Payer: Self-pay | Admitting: "Endocrinology

## 2021-05-25 ENCOUNTER — Other Ambulatory Visit: Payer: Self-pay

## 2021-05-25 ENCOUNTER — Ambulatory Visit (INDEPENDENT_AMBULATORY_CARE_PROVIDER_SITE_OTHER): Payer: Medicare Other | Admitting: "Endocrinology

## 2021-05-25 VITALS — BP 128/66 | HR 59 | Ht 70.0 in | Wt 223.0 lb

## 2021-05-25 DIAGNOSIS — E890 Postprocedural hypothyroidism: Secondary | ICD-10-CM | POA: Diagnosis not present

## 2021-05-25 MED ORDER — LEVOTHYROXINE SODIUM 112 MCG PO TABS: ORAL_TABLET | ORAL | 3 refills | Status: DC
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
## 2021-12-07 ENCOUNTER — Other Ambulatory Visit (HOSPITAL_COMMUNITY): Payer: Self-pay | Admitting: Family Medicine

## 2021-12-07 DIAGNOSIS — Z1231 Encounter for screening mammogram for malignant neoplasm of breast: Secondary | ICD-10-CM

## 2022-01-17 ENCOUNTER — Ambulatory Visit (HOSPITAL_COMMUNITY)
Admission: RE | Admit: 2022-01-17 | Discharge: 2022-01-17 | Disposition: A | Discharge status: Home / Self Care | Payer: Medicare Other | Source: Ambulatory Visit | Attending: Family Medicine | Admitting: Family Medicine

## 2022-01-17 ENCOUNTER — Other Ambulatory Visit: Payer: Self-pay

## 2022-01-17 DIAGNOSIS — Z1231 Encounter for screening mammogram for malignant neoplasm of breast: Secondary | ICD-10-CM | POA: Insufficient documentation

## 2022-01-18 ENCOUNTER — Other Ambulatory Visit (HOSPITAL_COMMUNITY): Payer: Self-pay | Admitting: Family Medicine

## 2022-01-19 ENCOUNTER — Other Ambulatory Visit (HOSPITAL_COMMUNITY): Payer: Self-pay | Admitting: Family Medicine

## 2022-01-25 ENCOUNTER — Other Ambulatory Visit (HOSPITAL_COMMUNITY): Payer: Self-pay | Admitting: Family Medicine

## 2022-01-25 ENCOUNTER — Other Ambulatory Visit (HOSPITAL_COMMUNITY): Payer: Self-pay | Admitting: Internal Medicine

## 2022-01-25 DIAGNOSIS — R928 Other abnormal and inconclusive findings on diagnostic imaging of breast: Secondary | ICD-10-CM

## 2022-01-27 ENCOUNTER — Other Ambulatory Visit (HOSPITAL_COMMUNITY): Payer: Self-pay | Admitting: Internal Medicine

## 2022-01-27 DIAGNOSIS — R928 Other abnormal and inconclusive findings on diagnostic imaging of breast: Secondary | ICD-10-CM

## 2022-02-10 ENCOUNTER — Encounter (HOSPITAL_COMMUNITY): Payer: Self-pay

## 2022-02-10 ENCOUNTER — Ambulatory Visit (HOSPITAL_COMMUNITY)
Admission: RE | Admit: 2022-02-10 | Discharge: 2022-02-10 | Disposition: A | Discharge status: Home / Self Care | Payer: Medicare Other | Source: Ambulatory Visit | Attending: Internal Medicine | Admitting: Internal Medicine

## 2022-02-10 ENCOUNTER — Other Ambulatory Visit: Payer: Self-pay

## 2022-02-10 DIAGNOSIS — R928 Other abnormal and inconclusive findings on diagnostic imaging of breast: Secondary | ICD-10-CM | POA: Diagnosis present

## 2022-03-01 ENCOUNTER — Other Ambulatory Visit (HOSPITAL_COMMUNITY): Payer: Medicare Other

## 2022-03-01 ENCOUNTER — Encounter (HOSPITAL_COMMUNITY): Payer: Medicare Other

## 2022-05-18 ENCOUNTER — Other Ambulatory Visit: Payer: Self-pay | Admitting: "Endocrinology

## 2022-05-18 DIAGNOSIS — E890 Postprocedural hypothyroidism: Secondary | ICD-10-CM

## 2022-05-19 LAB — TSH: TSH: 1.18 u[IU]/mL (ref 0.450–4.500)

## 2022-05-19 LAB — T4, FREE: Free T4: 1.56 ng/dL (ref 0.82–1.77)

## 2022-05-25 ENCOUNTER — Encounter: Payer: Self-pay | Admitting: "Endocrinology

## 2022-05-25 ENCOUNTER — Ambulatory Visit (INDEPENDENT_AMBULATORY_CARE_PROVIDER_SITE_OTHER): Payer: Medicare Other | Admitting: "Endocrinology

## 2022-05-25 VITALS — BP 116/58 | HR 60 | Ht 70.0 in | Wt 211.6 lb

## 2022-05-25 DIAGNOSIS — E890 Postprocedural hypothyroidism: Secondary | ICD-10-CM

## 2022-05-25 MED ORDER — LEVOTHYROXINE SODIUM 112 MCG PO TABS: ORAL_TABLET | ORAL | 3 refills | Status: DC
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
## 2022-08-09 ENCOUNTER — Emergency Department (HOSPITAL_COMMUNITY): Payer: Medicare Other

## 2022-08-09 ENCOUNTER — Emergency Department (HOSPITAL_COMMUNITY)
Admission: EM | Admit: 2022-08-09 | Discharge: 2022-08-10 | Discharge status: Against Medical Advice | Payer: Medicare Other | Attending: Student | Admitting: Student

## 2022-08-09 DIAGNOSIS — M62838 Other muscle spasm: Secondary | ICD-10-CM | POA: Diagnosis not present

## 2022-08-09 DIAGNOSIS — Z5321 Procedure and treatment not carried out due to patient leaving prior to being seen by health care provider: Secondary | ICD-10-CM | POA: Insufficient documentation

## 2022-08-09 DIAGNOSIS — R0602 Shortness of breath: Secondary | ICD-10-CM | POA: Diagnosis not present

## 2022-08-09 LAB — CBC WITH DIFFERENTIAL/PLATELET
Abs Immature Granulocytes: 0.02 10*3/uL (ref 0.00–0.07)
Basophils Absolute: 0.1 10*3/uL (ref 0.0–0.1)
Basophils Relative: 1 %
Eosinophils Absolute: 0.1 10*3/uL (ref 0.0–0.5)
Eosinophils Relative: 2 %
HCT: 37.1 % (ref 36.0–46.0)
Hemoglobin: 12.4 g/dL (ref 12.0–15.0)
Immature Granulocytes: 0 %
Lymphocytes Relative: 25 %
Lymphs Abs: 2.1 10*3/uL (ref 0.7–4.0)
MCH: 28.5 pg (ref 26.0–34.0)
MCHC: 33.4 g/dL (ref 30.0–36.0)
MCV: 85.3 fL (ref 80.0–100.0)
Monocytes Absolute: 0.8 10*3/uL (ref 0.1–1.0)
Monocytes Relative: 9 %
Neutro Abs: 5.3 10*3/uL (ref 1.7–7.7)
Neutrophils Relative %: 63 %
Platelets: 244 10*3/uL (ref 150–400)
RBC: 4.35 MIL/uL (ref 3.87–5.11)
RDW: 14.2 % (ref 11.5–15.5)
WBC: 8.4 10*3/uL (ref 4.0–10.5)
nRBC: 0 % (ref 0.0–0.2)

## 2022-08-09 LAB — COMPREHENSIVE METABOLIC PANEL
ALT: 13 U/L (ref 0–44)
AST: 21 U/L (ref 15–41)
Albumin: 3.9 g/dL (ref 3.5–5.0)
Alkaline Phosphatase: 80 U/L (ref 38–126)
Anion gap: 11 (ref 5–15)
BUN: 14 mg/dL (ref 8–23)
CO2: 24 mmol/L (ref 22–32)
Calcium: 9.5 mg/dL (ref 8.9–10.3)
Chloride: 102 mmol/L (ref 98–111)
Creatinine, Ser: 1.29 mg/dL — ABNORMAL HIGH (ref 0.44–1.00)
GFR, Estimated: 46 mL/min — ABNORMAL LOW (ref >60)
Glucose, Bld: 121 mg/dL — ABNORMAL HIGH (ref 70–99)
Potassium: 4 mmol/L (ref 3.5–5.1)
Sodium: 137 mmol/L (ref 135–145)
Total Bilirubin: 0.3 mg/dL (ref 0.3–1.2)
Total Protein: 6.7 g/dL (ref 6.5–8.1)

## 2022-08-09 LAB — CK: Total CK: 73 U/L (ref 38–234)

## 2022-08-09 LAB — MAGNESIUM: Magnesium: 1.8 mg/dL (ref 1.7–2.4)

## 2022-08-09 LAB — TROPONIN I (HIGH SENSITIVITY): Troponin I (High Sensitivity): 5 ng/L (ref <18)

## 2022-08-09 MED ORDER — OXYCODONE-ACETAMINOPHEN 5-325 MG PO TABS: 1.0000 | ORAL_TABLET | Freq: Once | ORAL | Status: DC

## 2022-08-09 NOTE — ED Provider Triage Note (Signed)
Emergency Medicine Provider Triage Evaluation Note  Penny Cox , a 66 y.o. female  was evaluated in triage.  Pt complains of shortness of breath & muscle spasms that started 30 minutes PTA. Patient reports generalized muscle spasms, worse in her chest and in her legs. Was sitting outside when sxs started. Denies cough, fever, vomiting, or diarrhea. .  Review of Systems  Per above  Physical Exam  BP 120/66   Pulse 61   Temp (!) 97.5 F (36.4 C) (Oral)   Resp 18   SpO2 100%  Gen:   Awake, no distress   Resp:  Normal effort  Cardiac:  Regular rate.  Other:  2+ radial & DP pusles, moving all extremities, compartments are soft x 4.    Medical Decision Making  Medically screening exam initiated at 10:06 PM.  Appropriate orders placed.  Penny Cox was informed that the remainder of the evaluation will be completed by another provider, this initial triage assessment does not replace that evaluation, and the importance of remaining in the ED until their evaluation is complete.  Dyspnea.  Myalgias.    Penny Cox, [place] 08/09/22 2218

## 2022-08-09 NOTE — ED Triage Notes (Signed)
Pt via [facility] c/o CP, SHOB, "spasms" x39min. Heart monitor in place since yesterday for eval of intermittent CP.  324ASA, fluids 20g L hand 158/94 HR

## 2022-08-10 NOTE — ED Notes (Signed)
Name called x3. No answer for repeat troponin

## 2022-08-10 NOTE — ED Notes (Signed)
IV removed by this Clinical research associate. Patient states daughter is on way now and I can remover her name from the system,

## 2022-08-10 NOTE — ED Notes (Signed)
Patient states her daughter is coming to get her and she doesn't want her vitals updated

## 2023-01-16 ENCOUNTER — Other Ambulatory Visit (HOSPITAL_COMMUNITY): Payer: Self-pay | Admitting: Family Medicine

## 2023-01-16 DIAGNOSIS — Z1231 Encounter for screening mammogram for malignant neoplasm of breast: Secondary | ICD-10-CM

## 2023-02-13 ENCOUNTER — Other Ambulatory Visit (HOSPITAL_COMMUNITY): Payer: Self-pay | Admitting: Internal Medicine

## 2023-02-13 ENCOUNTER — Encounter (HOSPITAL_COMMUNITY): Payer: Self-pay

## 2023-02-13 ENCOUNTER — Ambulatory Visit (HOSPITAL_COMMUNITY)
Admission: RE | Admit: 2023-02-13 | Discharge: 2023-02-13 | Disposition: A | Discharge status: Home / Self Care | Payer: Medicare Other | Source: Ambulatory Visit | Attending: Family Medicine | Admitting: Family Medicine

## 2023-02-13 DIAGNOSIS — Z1231 Encounter for screening mammogram for malignant neoplasm of breast: Secondary | ICD-10-CM | POA: Insufficient documentation

## 2023-05-16 ENCOUNTER — Observation Stay (HOSPITAL_COMMUNITY): Payer: Medicare Other

## 2023-05-16 ENCOUNTER — Encounter (HOSPITAL_COMMUNITY): Payer: Self-pay

## 2023-05-16 ENCOUNTER — Observation Stay (HOSPITAL_COMMUNITY)
Admission: EM | Admit: 2023-05-16 | Discharge: 2023-05-17 | Disposition: A | Discharge status: Home / Self Care | Payer: Medicare Other | Attending: Family Medicine | Admitting: Family Medicine

## 2023-05-16 ENCOUNTER — Other Ambulatory Visit: Payer: Self-pay

## 2023-05-16 DIAGNOSIS — W5911XA Bitten by nonvenomous snake, initial encounter: Secondary | ICD-10-CM | POA: Diagnosis not present

## 2023-05-16 DIAGNOSIS — T63091A Toxic effect of venom of other snake, accidental (unintentional), initial encounter: Secondary | ICD-10-CM | POA: Diagnosis not present

## 2023-05-16 DIAGNOSIS — Z87891 Personal history of nicotine dependence: Secondary | ICD-10-CM | POA: Diagnosis not present

## 2023-05-16 DIAGNOSIS — Z23 Encounter for immunization: Secondary | ICD-10-CM | POA: Diagnosis not present

## 2023-05-16 DIAGNOSIS — Z79899 Other long term (current) drug therapy: Secondary | ICD-10-CM | POA: Insufficient documentation

## 2023-05-16 DIAGNOSIS — R2689 Other abnormalities of gait and mobility: Secondary | ICD-10-CM

## 2023-05-16 DIAGNOSIS — I10 Essential (primary) hypertension: Secondary | ICD-10-CM | POA: Diagnosis not present

## 2023-05-16 DIAGNOSIS — J449 Chronic obstructive pulmonary disease, unspecified: Secondary | ICD-10-CM | POA: Insufficient documentation

## 2023-05-16 LAB — CBC WITH DIFFERENTIAL/PLATELET
Abs Immature Granulocytes: 0.02 10*3/uL (ref 0.00–0.07)
Abs Immature Granulocytes: 0.03 10*3/uL (ref 0.00–0.07)
Abs Immature Granulocytes: 0.05 10*3/uL (ref 0.00–0.07)
Basophils Absolute: 0 10*3/uL (ref 0.0–0.1)
Basophils Absolute: 0.1 10*3/uL (ref 0.0–0.1)
Basophils Absolute: 0 10*3/uL (ref 0.0–0.1)
Basophils Relative: 1 %
Basophils Relative: 1 %
Basophils Relative: 1 %
Eosinophils Absolute: 0.1 10*3/uL (ref 0.0–0.5)
Eosinophils Absolute: 0.1 10*3/uL (ref 0.0–0.5)
Eosinophils Absolute: 0.1 10*3/uL (ref 0.0–0.5)
Eosinophils Relative: 1 %
Eosinophils Relative: 1 %
Eosinophils Relative: 1 %
HCT: 38.4 % (ref 36.0–46.0)
HCT: 38 % (ref 36.0–46.0)
HCT: 37.9 % (ref 36.0–46.0)
Hemoglobin: 12.4 g/dL (ref 12.0–15.0)
Hemoglobin: 12.5 g/dL (ref 12.0–15.0)
Hemoglobin: 12.8 g/dL (ref 12.0–15.0)
Immature Granulocytes: 0 %
Immature Granulocytes: 0 %
Immature Granulocytes: 1 %
Lymphocytes Relative: 22 %
Lymphocytes Relative: 22 %
Lymphocytes Relative: 23 %
Lymphs Abs: 1.6 10*3/uL (ref 0.7–4.0)
Lymphs Abs: 2 10*3/uL (ref 0.7–4.0)
Lymphs Abs: 2 10*3/uL (ref 0.7–4.0)
MCH: 28.3 pg (ref 26.0–34.0)
MCH: 28.3 pg (ref 26.0–34.0)
MCH: 28.5 pg (ref 26.0–34.0)
MCHC: 32.3 g/dL (ref 30.0–36.0)
MCHC: 32.9 g/dL (ref 30.0–36.0)
MCHC: 33.8 g/dL (ref 30.0–36.0)
MCV: 87.7 fL (ref 80.0–100.0)
MCV: 86 fL (ref 80.0–100.0)
MCV: 84.4 fL (ref 80.0–100.0)
Monocytes Absolute: 0.6 10*3/uL (ref 0.1–1.0)
Monocytes Absolute: 0.7 10*3/uL (ref 0.1–1.0)
Monocytes Absolute: 0.8 10*3/uL (ref 0.1–1.0)
Monocytes Relative: 8 %
Monocytes Relative: 8 %
Monocytes Relative: 10 %
Neutro Abs: 5.1 10*3/uL (ref 1.7–7.7)
Neutro Abs: 5.9 10*3/uL (ref 1.7–7.7)
Neutro Abs: 5.7 10*3/uL (ref 1.7–7.7)
Neutrophils Relative %: 68 %
Neutrophils Relative %: 68 %
Neutrophils Relative %: 64 %
Platelets: 193 10*3/uL (ref 150–400)
Platelets: 186 10*3/uL (ref 150–400)
Platelets: 183 10*3/uL (ref 150–400)
RBC: 4.38 MIL/uL (ref 3.87–5.11)
RBC: 4.42 MIL/uL (ref 3.87–5.11)
RBC: 4.49 MIL/uL (ref 3.87–5.11)
RDW: 15.5 % (ref 11.5–15.5)
RDW: 14.9 % (ref 11.5–15.5)
RDW: 14.8 % (ref 11.5–15.5)
WBC: 7.4 10*3/uL (ref 4.0–10.5)
WBC: 8.8 10*3/uL (ref 4.0–10.5)
WBC: 8.8 10*3/uL (ref 4.0–10.5)
nRBC: 0 % (ref 0.0–0.2)
nRBC: 0 % (ref 0.0–0.2)
nRBC: 0 % (ref 0.0–0.2)

## 2023-05-16 LAB — PROTIME-INR
INR: 1.1 (ref 0.8–1.2)
INR: 1.1 (ref 0.8–1.2)
INR: 1.1 (ref 0.8–1.2)
Prothrombin Time: 14.4 seconds (ref 11.4–15.2)
Prothrombin Time: 14.2 seconds (ref 11.4–15.2)
Prothrombin Time: 14.4 seconds (ref 11.4–15.2)

## 2023-05-16 LAB — BASIC METABOLIC PANEL
Anion gap: 8 (ref 5–15)
BUN: 16 mg/dL (ref 8–23)
CO2: 25 mmol/L (ref 22–32)
Calcium: 9.1 mg/dL (ref 8.9–10.3)
Chloride: 102 mmol/L (ref 98–111)
Creatinine, Ser: 1.06 mg/dL — ABNORMAL HIGH (ref 0.44–1.00)
GFR, Estimated: 58 mL/min — ABNORMAL LOW (ref >60)
Glucose, Bld: 93 mg/dL (ref 70–99)
Potassium: 3.9 mmol/L (ref 3.5–5.1)
Sodium: 135 mmol/L (ref 135–145)

## 2023-05-16 LAB — FIBRINOGEN
Fibrinogen: 527 mg/dL — ABNORMAL HIGH (ref 210–475)
Fibrinogen: 552 mg/dL — ABNORMAL HIGH (ref 210–475)
Fibrinogen: 513 mg/dL — ABNORMAL HIGH (ref 210–475)

## 2023-05-16 LAB — CK: Total CK: 96 U/L (ref 38–234)

## 2023-05-16 LAB — LACTIC ACID, PLASMA
Lactic Acid, Venous: 1.1 mmol/L (ref 0.5–1.9)
Lactic Acid, Venous: 0.6 mmol/L (ref 0.5–1.9)

## 2023-05-16 LAB — HIV ANTIBODY (ROUTINE TESTING W REFLEX): HIV Screen 4th Generation wRfx: NONREACTIVE

## 2023-05-16 MED ORDER — BUPRENORPHINE HCL 600 MCG BU FILM: ORAL_FILM | Freq: Two times a day (BID) | BUCCAL | Status: DC

## 2023-05-16 MED ORDER — IRBESARTAN 300 MG PO TABS
300.0000 mg | ORAL_TABLET | Freq: Every day | ORAL | Status: DC
  Filled 2023-05-16 (×2): qty 1

## 2023-05-16 MED ORDER — LEVOTHYROXINE SODIUM 112 MCG PO TABS
112.0000 ug | ORAL_TABLET | Freq: Every day | ORAL | Status: DC
  Administered 2023-05-17: 112 ug via ORAL
  Filled 2023-05-16: qty 1

## 2023-05-16 MED ORDER — SODIUM CHLORIDE 0.9 % IV SOLN
4.0000 | Freq: Once | INTRAVENOUS | Status: DC
  Filled 2023-05-16: qty 72

## 2023-05-16 MED ORDER — AMLODIPINE-OLMESARTAN 10-40 MG PO TABS: 1.0000 | ORAL_TABLET | Freq: Every day | ORAL | Status: DC

## 2023-05-16 MED ORDER — SODIUM CHLORIDE 0.9 % IV SOLN
2.0000 | Freq: Once | INTRAVENOUS | Status: AC
  Administered 2023-05-16: 2 via INTRAVENOUS
  Filled 2023-05-16: qty 36

## 2023-05-16 MED ORDER — PRAVASTATIN SODIUM 40 MG PO TABS
40.0000 mg | ORAL_TABLET | Freq: Every day | ORAL | Status: DC
  Administered 2023-05-17: 40 mg via ORAL
  Filled 2023-05-16: qty 1

## 2023-05-16 MED ORDER — TRIAMTERENE-HCTZ 37.5-25 MG PO TABS: 1.0000 | ORAL_TABLET | Freq: Every day | ORAL | Status: DC

## 2023-05-16 MED ORDER — SODIUM CHLORIDE 0.9 % IV SOLN
4.0000 | Freq: Once | INTRAVENOUS | Status: AC
  Administered 2023-05-16: 4 via INTRAVENOUS
  Filled 2023-05-16: qty 72

## 2023-05-16 MED ORDER — HYDROMORPHONE HCL 1 MG/ML IJ SOLN
1.0000 mg | Freq: Once | INTRAMUSCULAR | Status: AC
  Administered 2023-05-16: 1 mg via INTRAVENOUS
  Filled 2023-05-16: qty 1

## 2023-05-16 MED ORDER — TORSEMIDE 20 MG PO TABS: 100.0000 mg | ORAL_TABLET | Freq: Every day | ORAL | Status: DC

## 2023-05-16 MED ORDER — TETANUS-DIPHTH-ACELL PERTUSSIS 5-2.5-18.5 LF-MCG/0.5 IM SUSY
0.5000 mL | PREFILLED_SYRINGE | Freq: Once | INTRAMUSCULAR | Status: AC
  Administered 2023-05-16: 0.5 mL via INTRAMUSCULAR
  Filled 2023-05-16: qty 0.5

## 2023-05-16 MED ORDER — ACETAMINOPHEN 500 MG PO TABS
500.0000 mg | ORAL_TABLET | Freq: Four times a day (QID) | ORAL | Status: DC
  Administered 2023-05-16 – 2023-05-17 (×2): 500 mg via ORAL
  Filled 2023-05-16 (×2): qty 1

## 2023-05-16 MED ORDER — AMLODIPINE BESYLATE 10 MG PO TABS
10.0000 mg | ORAL_TABLET | Freq: Every day | ORAL | Status: DC
  Administered 2023-05-17: 10 mg via ORAL
  Filled 2023-05-16: qty 1

## 2023-05-16 MED ORDER — GABAPENTIN 300 MG PO CAPS
600.0000 mg | ORAL_CAPSULE | Freq: Three times a day (TID) | ORAL | Status: DC
  Administered 2023-05-16 – 2023-05-17 (×3): 600 mg via ORAL
  Filled 2023-05-16 (×3): qty 2

## 2023-05-16 MED ORDER — LISINOPRIL 20 MG PO TABS: 30.0000 mg | ORAL_TABLET | Freq: Every day | ORAL | Status: DC

## 2023-05-16 MED ORDER — ENOXAPARIN SODIUM 40 MG/0.4ML IJ SOSY
40.0000 mg | PREFILLED_SYRINGE | INTRAMUSCULAR | Status: DC
Start: 2023-05-16 — End: 2023-05-16

## 2023-05-16 MED ORDER — BACLOFEN 10 MG PO TABS
20.0000 mg | ORAL_TABLET | Freq: Four times a day (QID) | ORAL | Status: DC
  Administered 2023-05-16 – 2023-05-17 (×4): 20 mg via ORAL
  Filled 2023-05-16 (×4): qty 2

## 2023-05-16 MED ORDER — TRAZODONE HCL 50 MG PO TABS
100.0000 mg | ORAL_TABLET | Freq: Every day | ORAL | Status: DC
  Administered 2023-05-16: 100 mg via ORAL
  Filled 2023-05-16: qty 2

## 2023-05-16 MED ORDER — ENOXAPARIN SODIUM 40 MG/0.4ML IJ SOSY
40.0000 mg | PREFILLED_SYRINGE | INTRAMUSCULAR | Status: DC
  Administered 2023-05-16: 40 mg via SUBCUTANEOUS
  Filled 2023-05-16: qty 0.4

## 2023-05-16 NOTE — Progress Notes (Signed)
Pt seen and examined, agree with PA M. Jeffery's A/P. Snake Bite to thumb with swelling, currently medically managed with antivenin. No indication for surgery at this time, will follow.
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
## 2023-05-16 NOTE — Progress Notes (Signed)
FMTS Interim Progress Note  S: Patient assessed at bedside with Dr. Yetta Barre. Reports her pain is improving and she feels like she can move her fingers more. Still having significant swelling that extends up her forearm. Reports it was a copperhead snake that bit her. Ready to leave the hospital as soon as possible.  O: BP (!) 185/69 (BP Location: Left Arm)   Pulse (!) 58   Temp (!) 97.3 F (36.3 C) (Oral)   Resp 14   Ht 5\' 9"  (1.753 m)   Wt 85.4 kg   SpO2 97%   BMI 27.80 kg/m    General: Well-appearing, sitting up in bed with sling on, NAD MSK: R hand with significant edema over thenar surface. Good cap refill of fingers. Mildly dusky appearance of thumb. Moves all fingers spontaneously. TTP over R thumb, thenar eminence and forearm  A/P: Copperhead snake bite Patient feels her swelling is mildly improved but still feels her forearm is more tight. Regaining more movement of fingers. Consulted Poison Control who recommended the following: Arm elevated above heart level and keep arm as straight as possible.  RN do serial circumferential measurements q1h around bite and 2 measures proximal (wrist and forearm).  Remove all ice from the affected area. No antibiotics. Given 2 vials CroFab now given significant edema If improved or stable after 2 vials, do not give more CroFab. If swelling has progressed or pain significantly worse, consider redosing CroFab. Poison Control faxing over management flow sheet and discharge instructions for patient  Remainder of plan per day team.  Elberta Fortis, MD 05/16/2023, 8:12 PM PGY-1, [facility] Service pager [phone]
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
## 2023-05-16 NOTE — Progress Notes (Signed)
New Admission Note:   Arrival Method: Stretcher Mental Orientation: Alert and oriented x4 Telemetry: none ordered Assessment: Completed Skin: see assessment IV: LFA Pain: 6/10 pain Tubes: none Safety Measures: Safety Fall Prevention Plan has been given, discussed and signed Admission: Completed [facility] Orientation: Patient has been orientated to the room, unit and staff.  Family: none  Orders have been reviewed and implemented. Will continue to monitor the patient. Call light has been placed within reach and bed alarm has been activated.   Stacie Glaze LPN [facility] Phone: [phone]

## 2023-05-16 NOTE — Hospital Course (Signed)
Just started  Infuses over 60 minutes, then re-evaluate 1 hour after completion- re-evalute around 6pm
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
## 2023-05-16 NOTE — ED Provider Notes (Signed)
[facility] EM[facility]Y DEPARTMENT AT [facility] Provider Note   CSN: [id number] Arrival date & time: 05/16/23  1311     History  Chief Complaint  Patient presents with   Snake Bite    Penny Cox is a 67 y.o. female.  HPI 67 year old female history of hypertension and hyperlipidemia presents today with snake bite to left hand.  She did see the snake after it bit her and states that she thinks it was a copperhead.  The bite occurred to the DIP joint first Penny Cox of right hand.  This occurred about 1230.  Patient has pain and swelling at the site that has increased while she has been in here in the emergency department.  She denies any chest pain, dyspnea, paresthesias.    Home Medications Prior to Admission medications   Medication Sig Start Date End Date Taking? Authorizing Provider  BELBUCA 600 MCG FILM 2 (two) times daily. 05/18/20   [provider]  Cholecalciferol (VITAMIN D3) 5000 units CAPS Take 5,000 Units by mouth daily.    [provider]  diclofenac Sodium (VOLTAREN) 1 % GEL Apply 1 application. topically as directed. 05/09/22   [provider]  doxepin (SINEQUAN) 150 MG capsule Take 150 mg by mouth at bedtime.    [provider]  EMGALITY 120 MG/ML SOAJ Inject 1 mL into the skin every 30 (thirty) days. 05/17/22   [provider]  gabapentin (NEURONTIN) 300 MG capsule Take 300 mg by mouth 4 (four) times daily.     [provider]  Glucosamine-Chondroitin-MSM 500-200-150 MG TABS Take 1 tablet by mouth 2 (two) times daily.    [provider]  levothyroxine (SYNTHROID) 112 MCG tablet TAKE 1 TABLET BY MOUTH EVERY DAY BEFORE BREAKFAST 05/25/22   Roma Kayser, MD  lisinopril (ZESTRIL) 30 MG tablet Take 30 mg by mouth daily. 02/27/22   [provider]  MELATONIN PO Take 15 mg by mouth at bedtime.    [provider]  Multiple Vitamin (MULTIVITAMIN ADULT PO) Take 1 tablet by mouth daily.     [provider]  NON FORMULARY CBD oil    [provider]  omeprazole (PRILOSEC) 20 MG capsule Take 20 mg by mouth 2 (two) times daily before a meal.     [provider]  oxyCODONE (ROXICODONE) 15 MG immediate release tablet Take 15 mg by mouth every 6 (six) hours as needed.  10/17/13   [provider]  polyethylene glycol (MIRALAX / GLYCOLAX) 17 g packet Take 17 g by mouth daily.    [provider]  pravastatin (PRAVACHOL) 40 MG tablet Take 40 mg by mouth daily.    [provider]  traZODone (DESYREL) 100 MG tablet Take 100 mg by mouth at bedtime. 04/27/18   [provider]  triamterene-hydrochlorothiazide (MAXZIDE-25) 37.5-25 MG tablet Take 1 tablet by mouth daily. 02/27/22   [provider]      Allergies    Advil [ibuprofen], Labetalol, and Lyrica [pregabalin]    Review of Systems   Review of Systems  Physical Exam Updated Vital Signs BP (!) 179/65 (BP Location: Left Arm)   Pulse (!) 50   Temp 97.9 F (36.6 C) (Oral)   Resp 20   Ht 1.753 m (5\' 9" )   Wt 87.1 kg   SpO2 99%   BMI 28.35 kg/m  Physical Exam Vitals and nursing note reviewed.  HENT:     Head: Normocephalic.     Right Ear: External ear  normal.     Left Ear: External ear normal.     Nose: Nose normal.     Mouth/Throat:     Pharynx: Oropharynx is clear.  Eyes:     Pupils: Pupils are equal, round, and reactive to light.  Cardiovascular:     Rate and Rhythm: Normal rate.     Pulses: Normal pulses.  Pulmonary:     Effort: Pulmonary effort is normal.  Abdominal:     General: Abdomen is flat.  Musculoskeletal:     Cervical back: Normal range of motion.     Comments: Right hand with puncture wounds over DIP joint of thumb with redness and swelling proximally over dorsum of hand extending greater than 15 cm  Skin:    General: Skin is warm and dry.     Capillary Refill: Capillary refill takes less than 2 seconds.  Neurological:     General:  No focal deficit present.     Mental Status: She is alert.  Psychiatric:        Mood and Affect: Mood normal.     ED Results / Procedures / Treatments   Labs (all labs ordered are listed, but only abnormal results are displayed) Labs Reviewed  FIBRINOGEN - Abnormal; Notable for the following components:      Result Value   Fibrinogen 527 (*)    All other components within normal limits  CBC WITH DIFFERENTIAL/PLATELET  PROTIME-INR  CBC WITH DIFFERENTIAL/PLATELET  CBC WITH DIFFERENTIAL/PLATELET  PROTIME-INR  PROTIME-INR  FIBRINOGEN  FIBRINOGEN  BASIC METABOLIC PANEL    EKG None  Radiology No results found.  Procedures .Critical Care  Performed by: Margarita Grizzle, MD Authorized by: Margarita Grizzle, MD   Critical care provider statement:    Critical care time (minutes):  30   Critical care end time:  05/16/2023 4:19 PM   Critical care was time spent personally by me on the following activities:  Development of treatment plan with patient or surrogate, discussions with consultants, evaluation of patient's response to treatment, examination of patient, ordering and review of laboratory studies, ordering and review of radiographic studies, ordering and performing treatments and interventions, pulse oximetry, re-evaluation of patient's condition and review of old charts     Medications Ordered in ED Medications  crotalidae polyvalent immune fab (CROFAB) 4 vial in sodium chloride 0.9 % 250 mL infusion (has no administration in time range)  HYDROmorphone (DILAUDID) injection 1 mg (has no administration in time range)  Tdap (BOOSTRIX) injection 0.5 mL (has no administration in time range)  HYDROmorphone (DILAUDID) injection 1 mg (1 mg Intravenous Given 05/16/23 1420)    ED Course/ Medical Decision Making/ A&P Clinical Course as of 05/16/23 1619  Tue May 16, 2023  1553 -Genaspor 27 mg/dL INR is normal with PT 16.1 which is normal [DR]    Clinical Course User Index [DR] Margarita Grizzle, MD                             Medical Decision Making Amount and/or Complexity of Data Reviewed Labs: ordered.  Risk Prescription drug management.  Initial CroFab dose should be administered within 6 hours of snakebite!     Vaccination: Update tetanus vaccine information. If last vaccination >10 years or unknown, give tetanus booster, Tdap. Criteria for administration of CroFab: CroFab is indicated to treat any local or systemic signs of envenomation such as pain, swelling, fasciculation, paresthesias, or measured hematologic abnormalities (thrombocytopenia, hypofibrinoginemia, or  prothrombin time elevations).   Criteria                                                                                                                                                                         Pulmonary Symptoms: 0 - No symptoms/signs  1 - Dyspnea, minimal chest tightness, mild or vague discomfort, or respirations of 20-25 breaths/minute 2 - Moderate respiratory distress (tachypnea, 26-40 breaths/minute; accessory muscle use) 3 - Cyanosis, air hunger, extreme tachypnea, or respiratory insufficiency/failure (3)   Cardiovascular Symptoms: 0 - No symptoms/signs 1 - Tachycardia (100-125 BPM), palpitations, generalized weakness, benign dysrhythmia, or hypotension 2 - Tachycardia (126-175 BPM) or hypotension, with SBP > 100 mmHg 3 - Extreme Tachycarida (>175 BPM), hypotension with SBP < 100 mmHg, malignant dysrhythmia, or cardiac arrest   Local Wound: 0 - No symptoms/signs 1 - Pain, swelling, or ecchymosis within 5-7.5cm of bite site 2 - Pain, swelling, or ecchymosis involving less than half the extremity (7.5-50cm from bite site) 3 - Pain, swelling, or ecchymosis involving half to all of the extremity (50-100cm from bite site) 4 - Pain, swelling, or ecchymosis extending beyond affected extremity (more than 100cm from the bite site) Gastrointestinal System: 0 - No  symptoms/signs 1 - Pain, tenesmus or nausea 2 - Vomiting or diarrhea 3 - Repeated vomiting, diarrhea, hematemesis, or hematochezia   Hematologic Symptoms: 0 - No symptoms/signs 1 - Coagulation parameters slightly abnormal: PT <20 sec, PTT <50 sec, PLT 100-150K/ml, or fibrinogen 100-150 mcg/ml 2 - Coagulation parameters abnormal: PT <20-25 sec, PTT <50-75 sec, PLT 50-100K/ml, or fibrinogen 50-100 mcg/ml 3 - Coagulation parameters abnormal: PT <50-100 sec, PTT <75-100 sec, PLT 20-50K/ml, or fibrinogen 50 mcg/ml 4 - Coagulation parameters markedly abnormal with serious bleeding or the threat of spontaneous bleeding: unmeasurable PT or PTT, PLT <20K/ml, or undetectable fibrinogen; severe abnomalities of other laboratory values also fall into this category   Central Nervous System: 0 - No symptoms/signs 1 - Minimal apprehension, headache, weakness, dizziness, chills, or paresthesia 2 - Moderate apprehension, headache, weakness, dizziness, chills, paresthesia, confusion, or fasciculation in area of bite site 3 - Severe confusion, lethargy, seizures, coma, psychosis, or generalized fasciculation   Score:          Severity of Envenomation  Snakebite Severity Score 0-3 (Minimal) 4-7 (Moderate) 8-20 (Severe)  CroFab Indicated? No Yes Yes; may require multiple doses    Patient with snake bite to right hand occurred approximately 1 hour 45 minutes ago at this time.  Current severity level at 3 based on pain swelling and some apprehension. Labs are pending CroFab is initiated  67 year old female presents today with snake bite to right hand.  She has been dosed with CroFab but has having increasing  swelling and pain.  Initial swelling on, then to dorsal hand now part way up forearm. Orthopedic hand surgery, saw and evaluated.  No compartment syndrome at this time.  Will need to be reconsulted if worsening symptoms Patient requiring ongoing pain medicine and will require repeat CroFab dosing Initial  dose infusing at this time we will infuse over the next hour.  Will need reassessment and likely redosing. Care discussed with family practice resident who will see for admission       Final Clinical Impression(s) / ED Diagnoses Final diagnoses:  Snake bite, initial encounter    Rx / DC Orders ED Discharge Orders     None         Margarita Grizzle, MD 05/16/23 [phone]

## 2023-05-16 NOTE — Progress Notes (Signed)
Sling and 2 pillows put under right arm.  Ice pack applied.  Stacie Glaze LPN

## 2023-05-16 NOTE — Plan of Care (Signed)

## 2023-05-16 NOTE — ED Triage Notes (Signed)
Pt BIB EMS for a snake bite to the right thumb.  Pt working in her yard.

## 2023-05-16 NOTE — Assessment & Plan Note (Addendum)
Obvious swelling and puncture bites to right DIP. Currently receiving CroFab antivenom 4 vials, will need re-check 1 hour after infusion completes. Admit for pain control and antivenom. - Admit to FM TS, attending Dr. Lum Babe - Appreciate orthopedic surgery recommendations- no surgical indication at this time, but will re-consult if worsening signs/symptoms - Continue CroFab, recheck for improvement at 7:30 PM. If no improvement in pain/swelling, will re-dose with 4 vials followed by maintenance thereafter -- Monitor closely for adverse reactions to antivenom - S/p Dilaudid x2. Tylenol and Ibuprofen as needed for pain -- CXR pending -- CK, lactic acid, UA pending -- Protime-INR, fibrinogen pending -- Regular diet - PT/OT - Vital signs - AM CBC, BMP

## 2023-05-17 DIAGNOSIS — R2689 Other abnormalities of gait and mobility: Secondary | ICD-10-CM | POA: Diagnosis not present

## 2023-05-17 DIAGNOSIS — W5911XA Bitten by nonvenomous snake, initial encounter: Secondary | ICD-10-CM | POA: Diagnosis not present

## 2023-05-17 MED ORDER — BUPRENORPHINE HCL 600 MCG BU FILM
1.0000 | ORAL_FILM | Freq: Two times a day (BID) | BUCCAL | Status: DC
  Administered 2023-05-17: 1 via BUCCAL

## 2023-05-17 NOTE — Evaluation (Signed)
Occupational Therapy Evaluation Patient Details Name: Penny Cox MRN: [id number] DOB: 08-Jan-1956 Today's Date: 05/17/2023   History of Present Illness Pt is a 67 y.o. female presenting 5/28 with snake (copperhead) bite to DIP joint first ray of R hand. Provided antivenin. PMH significant for COPD, MDD, GAD, Graves dx, Hep C, HTN, HLD.   Clinical Impression   PTA, pt lived with her step father who she assists with bathing, dressing, cooking, etc. Upon eval, pt with RUE edema, decr ROM, and strength. Educating pt regarding ROM to optimize function and decr edema. Pt performing BADL with up to supervision due to also with mild balance deficits, but using RUE functionally during dressing and grooming, Will follow acutely, but pt not interested in OP therapies at this time due to likely full return of functional use with decr swelling. Education regarding how to obtain referral if needed.      Recommendations for follow up therapy are one component of a multi-disciplinary discharge planning process, led by the attending physician.  Recommendations may be updated based on patient status, additional functional criteria and insurance authorization.   Assistance Recommended at Discharge PRN  Patient can return home with the following Assist for transportation    Functional Status Assessment  Patient has had a recent decline in their functional status and demonstrates the ability to make significant improvements in function in a reasonable and predictable amount of time.  Equipment Recommendations  None recommended by OT    Recommendations for Other Services       Precautions / Restrictions Precautions Precautions: Fall      Mobility Bed Mobility Overal bed mobility: Independent                  Transfers Overall transfer level: Modified independent Equipment used: None                      Balance Overall balance assessment: Mild deficits observed, not formally  tested (PT informed of one LOB during session while standing at sink with pt able to self correct)                                         ADL either performed or assessed with clinical judgement   ADL Overall ADL's : Needs assistance/impaired Eating/Feeding: Independent;Modified independent   Grooming: Oral care;Supervision/safety;Standing Grooming Details (indicate cue type and reason): at sink; good automatic use of compensatory strategies and actively using affected RUE as dominant hand during oral care Upper Body Bathing: Sitting;Modified independent   Lower Body Bathing: Modified independent;Sit to/from stand   Upper Body Dressing : Modified independent;Sitting   Lower Body Dressing: Modified independent;Sit to/from stand Lower Body Dressing Details (indicate cue type and reason): compensatory techniques for position of RUE Toilet Transfer: Supervision/safety;Ambulation   Toileting- Clothing Manipulation and Hygiene: Modified independent   Tub/ Shower Transfer: Tub transfer;Supervision/safety;Ambulation   Functional mobility during ADLs: Supervision/safety General ADL Comments: supervision for longer distances and prolonged standing due to balance difficulty pt reports is her baseline     Vision Baseline Vision/History: 1 Wears glasses Ability to See in Adequate Light: 0 Adequate Patient Visual Report: No change from baseline Vision Assessment?: No apparent visual deficits     Perception Perception Perception Tested?: No   Praxis Praxis Praxis tested?: Within functional limits    Pertinent Vitals/Pain Pain Assessment Pain Assessment: Faces Faces Pain Scale: Hurts  little more Pain Location: R hand with mobility/ROM Pain Descriptors / Indicators: Aching, Sore, Tender Pain Intervention(s): Monitored during session, Limited activity within patient's tolerance     Hand Dominance Right   Extremity/Trunk Assessment Upper Extremity Assessment Upper  Extremity Assessment: RUE deficits/detail RUE Deficits / Details: Decr wrist flexion ~60 degrees, MCPflexion ~70 degrees, PIP flexion ~75 degrees, Thumb opposition to second and third digit and lcking ~36mm to oppose to 4th digit. Edema noted and nursing has been providing objective measurement, thus not formally assessed.   Lower Extremity Assessment Lower Extremity Assessment: Defer to PT evaluation   Cervical / Trunk Assessment Cervical / Trunk Assessment: Normal   Communication Communication Communication: No difficulties   Cognition Arousal/Alertness: Awake/alert Behavior During Therapy: WFL for tasks assessed/performed Overall Cognitive Status: Within Functional Limits for tasks assessed                                       General Comments  VSS    Exercises Exercises: Hand activities Hand Exercises Forearm Supination: AROM, Right, 10 reps Forearm Pronation: Right, 10 reps, AROM Wrist Flexion: AROM, Right, 10 reps Wrist Extension: AROM, Right, 10 reps Wrist Ulnar Deviation: AROM, Right, 10 reps Wrist Radial Deviation: AROM, Right, 10 reps Digit Composite Flexion: AROM, Right, 10 reps Composite Extension: AROM, Right, 10 reps Digit Composite Abduction: AROM, Right, 10 reps Digit Composite Adduction: AROM, Right, 10 reps Thumb Abduction: AROM, Right, 10 reps Thumb Adduction: AROM, Right, 10 reps Opposition: AROM, Right, 10 reps   Shoulder Instructions      Home Living Family/patient expects to be discharged to:: Private residence Living Arrangements: Parent (caregiver to 29 y.o. stepfather) Available Help at Discharge: Family (sisters) Type of Home: House Home Access: Stairs to enter Secretary/administrator of Steps: 1   Home Layout: Two level;Other (Comment) (bed in basement; bath on main level) Alternate Level Stairs-Number of Steps: Once in the house, 5 stair up to main level or 10 down to basement Alternate Level Stairs-Rails: Right Bathroom  Shower/Tub: Chief Strategy Officer: Standard     Home Equipment:  (shower chair for step father but she does not use)          Prior Functioning/Environment Prior Level of Function : Independent/Modified Independent;Driving               ADLs Comments: likes to work in her garden        OT Problem List: Decreased strength;Decreased range of motion;Impaired balance (sitting and/or standing);Impaired UE functional use      OT Treatment/Interventions: Self-care/ADL training;Therapeutic exercise;DME and/or AE instruction;Balance training;Patient/family education;Therapeutic activities    OT Goals(Current goals can be found in the care plan section) Acute Rehab OT Goals Patient Stated Goal: go home today OT Goal Formulation: With patient Time For Goal Achievement: 05/31/23 Potential to Achieve Goals: Good  OT Frequency: Min 2X/week    Co-evaluation              AM-PAC OT "6 Clicks" Daily Activity     Outcome Measure Help from another person eating meals?: None Help from another person taking care of personal grooming?: A Little Help from another person toileting, which includes using toliet, bedpan, or urinal?: A Little Help from another person bathing (including washing, rinsing, drying)?: None Help from another person to put on and taking off regular upper body clothing?: None Help from another person to put on  and taking off regular lower body clothing?: None 6 Click Score: 22   End of Session Equipment Utilized During Treatment: Gait belt Nurse Communication: Mobility status  Activity Tolerance: Patient tolerated treatment well Patient left: in bed;with call bell/phone within reach  OT Visit Diagnosis: Unsteadiness on feet (R26.81);Muscle weakness (generalized) (M62.81)                Time: 1610-9604 OT Time Calculation (min): 26 min Charges:  OT General Charges $OT Visit: 1 Visit OT Evaluation $OT Eval Low Complexity: 1 Low OT  Treatments $Self Care/Home Management : 8-22 mins  Tyler Deis, OTR/L [facility] Acute Rehabilitation Office: [phone]   Myrla Halsted 05/17/2023, 11:13 AM

## 2023-05-17 NOTE — Discharge Instructions (Addendum)
Thank you for letting us care for you during your stay.  You were admitted to the [facility] Medicine Teaching Service.   You were admitted for snakebite.  Please keep hand elevated at a "waving" level. Do NOT put ice on your hand. Take tylenol as needed for pain.  Please follow up with your primary care physician the day after you go home from the hospital.  If you have any questions about your snake bite, call Poison Control at [phone].  Please let us know if you have questions about your stay at [facility].

## 2023-05-17 NOTE — Progress Notes (Deleted)
Subjective/Objective No new subjective & objective note has been filed under this hospital service since the last note was generated.   Scheduled Meds:  acetaminophen  500 mg Oral Q6H   amLODipine  10 mg Oral Daily   baclofen  20 mg Oral Q6H   enoxaparin (LOVENOX) injection  40 mg Subcutaneous Q24H   gabapentin  600 mg Oral TID   irbesartan  300 mg Oral Daily   levothyroxine  112 mcg Oral Q0600   pravastatin  40 mg Oral Daily   traZODone  100 mg Oral QHS   Continuous Infusions: PRN Meds:  Vital signs in last 24 hours: Temp:  [97.3 F (36.3 C)-97.9 F (36.6 C)] 97.4 F (36.3 C) (05/29 0438) Pulse Rate:  [50-63] 57 (05/29 0438) Resp:  [14-20] 18 (05/28 2027) BP: (157-185)/(60-69) 167/60 (05/29 0438) SpO2:  [93 %-99 %] 96 % (05/29 0438) Weight:  [85.4 kg-87.1 kg] 85.4 kg (05/28 1800)  Intake/Output last 3 shifts: I/O last 3 completed shifts: In: 387.5 [IV Piggyback:387.5] Out: 0  Intake/Output this shift: No intake/output data recorded.  Problem Assessment/Plan Other * Snake bite Assessment & Plan Obvious swelling and puncture bites to right DIP. Currently receiving CroFab antivenom 4 vials, will need re-check 1 hour after infusion completes. Admit for pain control and antivenom. - Admit to FM TS, attending Dr. Lum Babe - Appreciate orthopedic surgery recommendations- no surgical indication at this time, but will re-consult if worsening signs/symptoms - Continue CroFab, recheck for improvement at 7:30 PM. If no improvement in pain/swelling, will re-dose with 4 vials followed by maintenance thereafter -- Monitor closely for adverse reactions to antivenom - S/p Dilaudid x2. Tylenol and Ibuprofen as needed for pain -- CXR pending -- CK, lactic acid, UA pending -- Protime-INR, fibrinogen pending -- Regular diet - PT/OT - Vital signs - AM CBC, BMP

## 2023-05-17 NOTE — Progress Notes (Signed)
Pts measurements 2100 thumb 3, wrist 7 and forearm was 9.5. These measurements stayed the same every hour with no change. After antivenom given, pts measurements 4 hrs later from last was the same. No change. The pt states this morning that it feels better. The pain is all in the thumb. No additional bruising noted.

## 2023-05-17 NOTE — Care Management Obs Status (Signed)
MEDICARE OBSERVATION STATUS NOTIFICATION   Patient Details  Name: Penny Cox MRN: [id number] Date of Birth: 11-09-1956   Medicare Observation Status Notification Given:  Yes    Tom-Johnson, Hershal Coria, RN 05/17/2023, 12:11 PM

## 2023-05-17 NOTE — Progress Notes (Signed)
FMTS Interim Progress Note  S: Patient assessed at bedside with Dr. Yetta Barre. Sleeping soundly. Reports her pain and swelling has not gotten worse since last evaluation. Feels it is about the same or slightly better.  O: BP (!) 157/64 (BP Location: Left Arm)   Pulse (!) 51   Temp (!) 97.5 F (36.4 C) (Oral)   Resp 18   Ht 5\' 9"  (1.753 m)   Wt 85.4 kg   SpO2 93%   BMI 27.80 kg/m    General: resting comfortably, NAD MSK: R hand with stable edema over thenar surface. Good cap refill of fingers. Mildly dusky appearance of thumb. Moves all fingers spontaneously. Non-tender to light palpation over thumb and forearm  A/P: Copperhead snake bite  Stable edema and pain. Will not redose anti-venom per Poison Control recommendation. Per management protocol, will re-measure circumferences in 4 hours from the last dose and advised RN to notify if increasing in size. -Circumferential measurement q4h, RN to log measurements in chart -Aftercare instructions to be provided to patient  Elberta Fortis, MD 05/17/2023, 12:16 AM PGY-1, [facility] Family Medicine Service pager [phone]

## 2023-05-17 NOTE — Progress Notes (Signed)
     Daily Progress Note Intern Pager: [phone]  Patient name: Penny Cox Medical record number: [id number] Date of birth: May 30, 1956 Age: 67 y.o. Gender: female  Primary Care Provider: Salli Real, MD Consultants: Orthopedic surgery Code Status: FULL  Pt Overview and Major Events to Date:  5/28-admitted  Assessment and Plan: Penny Cox is a 67 y.o. female presenting with snake bite to right thumb. She is hemodynamically stable without signs of shock or respiratory/cardiac compromise.   PMH: HTN, HLD, fibromyalgia, tobacco use, hypothyroidism, chronic pain syndrome.  * Snake bite Obvious swelling and puncture bites to right DIP. Received CroFab antivenom 4 vials, re-check 1 hour after infusion and tightness was present so poison control was called. They recommended giving an additional 2 vials of Crofab. CXR does not show pulmonary edema. CK and lactic acid unremarkable. Fibrinogen downtrending. - Appreciate orthopedic surgery recommendations- no surgical indication at this time - Completed Crofab antivenom - Tylenol as needed for pain - PT/OT    Chronic and stable medical conditions Hypertension-continue amlodipine-olmesartan Hyperlipidemia-continue pravastatin Fibromyalgia/neuropathy-continue baclofen, gabapentin Hypothyroidism-continue Synthroid Insomnia-continue trazodone   FEN/GI: Heart PPx: Lovenox Dispo: Home pending clinical improvement  Subjective:  Patient seen this morning.  Patient reports improving pain in her hand.  She has improving range of motion in her right hand as well, radial pulses are palpable.  Objective: Temp:  [97.3 F (36.3 C)-97.9 F (36.6 C)] 97.4 F (36.3 C) (05/29 0438) Pulse Rate:  [50-63] 57 (05/29 0438) Resp:  [14-20] 18 (05/28 2027) BP: (157-185)/(60-69) 167/60 (05/29 0438) SpO2:  [93 %-99 %] 96 % (05/29 0438) Weight:  [188 lb 4.4 oz (85.4 kg)-192 lb (87.1 kg)] 188 lb 4.4 oz (85.4 kg) (05/28 1800) Physical Exam: General:  Elderly female in bed, NAD Cardiovascular: RRR, no murmur Respiratory: Normal effort, CTA anteriorly Extremities: Swollen right hand, improving range of motion, palpable radial pulse  Laboratory: Most recent CBC Lab Results  Component Value Date   WBC 8.8 05/16/2023   HGB 12.8 05/16/2023   HCT 37.9 05/16/2023   MCV 84.4 05/16/2023   PLT 183 05/16/2023   Most recent BMP    Latest Ref Rng & Units 05/16/2023    3:47 PM  BMP  Glucose 70 - 99 mg/dL 93   BUN 8 - 23 mg/dL 16   Creatinine 1.47 - 1.00 mg/dL 8.29   Sodium 562 - 130 mmol/L 135   Potassium 3.5 - 5.1 mmol/L 3.9   Chloride 98 - 111 mmol/L 102   CO2 22 - 32 mmol/L 25   Calcium 8.9 - 10.3 mg/dL 9.1     Other pertinent labs  PT-INR WNL Fibrinogen 552> 513 CK WNL  Lactic acid WNL  Imaging/Diagnostic Tests: Chest x-ray 5/28 IMPRESSION: No active cardiopulmonary disease.  Lance Muss, MD 05/17/2023, 7:22 AM  PGY-1, [facility] Family Medicine FPTS Intern pager: [phone], text pages welcome Secure chat group [facility] Teaching Service

## 2023-05-17 NOTE — Discharge Summary (Addendum)
Family Medicine Teaching [facility] Discharge Summary  Patient name: Penny Cox Medical record number: [id number] Date of birth: [birth year] Age: 67 y.o. Gender: female Date of Admission: 05/16/2023  Date of Discharge: 05/17/2023 Admitting Physician: Darral Dash, DO  Primary Care Provider: Salli Real, MD Consultants: Orthopedic surgery  Indication for Hospitalization: snake bite  Discharge Diagnoses/Problem List:  Principal Problem for Admission: snake bite Other Problems addressed during stay:  Principal Problem:   Snake bite    Brief Hospital Course:  Penny Cox is a 67 y.o.female with a history of COPD, MDD, GAD, Graves dx, Hep C, HTN and HLD who was admitted to the [facility] Medicine Teaching Service at [facility] for copperhead snake bite. Her hospital course is detailed below:  Copperhead snake bite Presented after copperhead snake bite while gardening around 12:30PM. Obvious puncture bites and thumb swelling in ED, received 4 vials CroFab due to progressive swelling involving the hand and forearm. Orthopediac surgery consulted, did not have any further recommendations. Patient reassessed 4 hours later and found to have increased swelling of forearm therefore additional 2 vials CroFab given. Contacted Poison Control who recommended serial circumferential measurements which remained unchanged. Recontacted Poison Control, and they cleared the patient for discharge after updates given. We provided the patient with poison control number and recommended the patient follow-up with her PCP tomorrow to monitor hand edema.   Other chronic conditions were medically managed with home medications and formulary alternatives as necessary (HTN, HLD, hypothyroidism, insomnia)  PCP Follow-up Recommendations:  Monitor for resolution of edema and pain Patient WILL have continued swelling and pain. Please contact poison control at follow up appt with PCP to discuss further steps outpatient.   Discuss safety with outdoor activities  Disposition: Home  Discharge Condition: Stable   Discharge Exam:  Vitals:   05/17/23 0438 05/17/23 0856  BP: (!) 167/60 (!) 141/68  Pulse: (!) 57 (!) 53  Resp:  18  Temp: (!) 97.4 F (36.3 C) 98.5 F (36.9 C)  SpO2: 96% 95%   Physical Exam: General: Elderly female in bed, NAD Cardiovascular: RRR, no murmur Respiratory: Normal effort, CTA anteriorly Extremities: Swollen right hand, improving range of motion, palpable radial pulse  Significant Procedures: none  Significant Labs and Imaging:  Recent Labs  Lab 05/16/23 1421 05/16/23 1748 05/16/23 2219  WBC 7.4 8.8 8.8  HGB 12.4 12.5 12.8  HCT 38.4 38.0 37.9  PLT 193 186 183   Recent Labs  Lab 05/16/23 1547  NA 135  K 3.9  CL 102  CO2 25  GLUCOSE 93  BUN 16  CREATININE 1.06*  CALCIUM 9.1    Chest x-ray 5/28 IMPRESSION: No active cardiopulmonary disease.  Results/Tests Pending at Time of Discharge: none  Discharge Medications:  Allergies as of 05/17/2023       Reactions   Advil [ibuprofen]    Labetalol    Lyrica [pregabalin]    Makes her very groggy        Medication List     TAKE these medications    allopurinol 300 MG tablet Commonly known as: ZYLOPRIM Take 300 mg by mouth at bedtime.   amLODipine-olmesartan 10-40 MG tablet Commonly known as: AZOR Take 1 tablet by mouth daily.   B-12 50 MCG Tabs Take 1 tablet by mouth daily.   baclofen 20 MG tablet Commonly known as: LIORESAL Take 20 mg by mouth every 6 (six) hours.   Belbuca 600 MCG Film Generic drug: Buprenorphine HCl 2 (two) times daily.   Biotin 5  MG Tabs Take 2 tablets by mouth at bedtime.   diclofenac Sodium 1 % Gel Commonly known as: VOLTAREN Apply 1 application. topically as directed.   doxepin 150 MG capsule Commonly known as: SINEQUAN Take 150 mg by mouth at bedtime.   Emgality 120 MG/ML Soaj Generic drug: Galcanezumab-gnlm Inject 1 mL into the skin every 30 (thirty)  days.   gabapentin 600 MG tablet Commonly known as: NEURONTIN Take 600 mg by mouth 3 (three) times daily.   Glucosamine-Chondroitin-MSM 500-200-150 MG Tabs Take 1 tablet by mouth 2 (two) times daily.   levothyroxine 112 MCG tablet Commonly known as: SYNTHROID TAKE 1 TABLET BY MOUTH EVERY DAY BEFORE BREAKFAST   MELATONIN PO Take 15 mg by mouth at bedtime.   MULTIVITAMIN ADULT PO Take 1 tablet by mouth daily.   omeprazole 20 MG capsule Commonly known as: PRILOSEC Take 20 mg by mouth 2 (two) times daily before a meal.   oxyCODONE 15 MG immediate release tablet Commonly known as: ROXICODONE Take 15 mg by mouth every 6 (six) hours as needed.   polyethylene glycol 17 g packet Commonly known as: MIRALAX / GLYCOLAX Take 17 g by mouth daily.   pravastatin 40 MG tablet Commonly known as: PRAVACHOL Take 40 mg by mouth daily.   torsemide 100 MG tablet Commonly known as: DEMADEX Take 100 mg by mouth at bedtime.   traZODone 100 MG tablet Commonly known as: DESYREL Take 100 mg by mouth at bedtime.   triamterene-hydrochlorothiazide 37.5-25 MG tablet Commonly known as: MAXZIDE-25 Take 1 tablet by mouth daily.   Vitamin C 500 MG Caps Take 1 capsule by mouth daily.   Vitamin D3 125 MCG (5000 UT) Caps Take 5,000 Units by mouth daily.   Vitamin E 67 MG (100 UNIT) Tabs Take 1 tablet by mouth 2 (two) times daily.        Discharge Instructions: Please refer to Patient Instructions section of EMR for full details.  Patient was counseled important signs and symptoms that should prompt return to medical care, changes in medications, dietary instructions, activity restrictions, and follow up appointments.   Follow-Up Appointments:  Follow-up Information     Salli Real, MD. Schedule an appointment as soon as possible for a visit in 1 day(s).   Specialty: Internal Medicine Contact information: [address] [place] [address] [phone]                  Alfredo Martinez, MD 05/17/2023, 11:58 AM PGY-1, [facility] Family Medicine

## 2023-05-17 NOTE — Progress Notes (Signed)
PT Cancellation Note  Patient Details Name: Penny Cox MRN: [id number] DOB: 1956-06-07   Cancelled Treatment:    Reason Eval/Treat Not Completed: Fatigue/lethargy limiting ability to participate. Pt sleeping and unable to be roused at this time. Noted she worked with OT earlier this morning. Will check back as schedule allows to initiate PT eval.    Marylynn Pearson 05/17/2023, 10:37 AM  Conni Slipper, PT, DPT Acute Rehabilitation Services Secure Chat Preferred Office: [phone]

## 2023-05-17 NOTE — TOC Transition Note (Signed)
Transition of Care [facility]) - CM/SW Discharge Note   Patient Details  Name: Penny Cox MRN: [id number] Date of Birth: 13-Jul-1956  Transition [facility] CM/SW Contact:  Tom-Johnson, Hershal Coria, RN Phone Number: 05/17/2023, 12:08 PM   Clinical Narrative:     Patient is scheduled for discharge today.  Outpatient referral, hospital f/u and discharge instructions on AVS. Daughter, Shanda Bumps to transport at discharge.  No further TOC needs noted.        Final next level of care: OP Rehab Barriers to Discharge: Barriers Resolved   Patient Goals and CMS Choice CMS Medicare.gov Compare Post Acute Care list provided to:: Patient Choice offered to / list presented to : NA  Discharge Placement                  Patient to be transferred to facility by: Daughter Name of family member notified: [facility]    Discharge Plan and Services Additional resources added to the After Visit Summary for                  DME Arranged: N/A DME Agency: NA       HH Arranged: NA HH Agency: NA        Social Determinants of Health (SDOH) Interventions SDOH Screenings   Food Insecurity: No Food Insecurity (05/16/2023)  Housing: Low Risk  (05/16/2023)  Transportation Needs: No Transportation Needs (05/16/2023)  Utilities: Not At Risk (05/16/2023)  Tobacco Use: Medium Risk (05/16/2023)     Readmission Risk Interventions     No data to display

## 2023-05-17 NOTE — Progress Notes (Signed)
DISCHARGE NOTE HOME Penny Cox to be discharged Home per MD order. Discussed prescriptions and follow up appointments with the patient. Prescriptions given to patient; medication list explained in detail. Patient verbalized understanding.  Skin clean, dry and intact without evidence of skin break down, no evidence of skin tears noted. IV catheter discontinued intact. Site without signs and symptoms of complications. Dressing and pressure applied. Pt denies pain at the site currently. No complaints noted.  Patient free of lines, drains, and wounds.   An After Visit Summary (AVS) was printed and given to the patient. Patient requesting to walk to main entrance without being accompanied by staff. States she will be picked up by her daughter Eshani Milberger.  Margarita Grizzle, RN

## 2023-05-17 NOTE — Evaluation (Addendum)
Physical Therapy Evaluation and Discharge  Patient Details Name: Penny Cox MRN: [id number] DOB: January 19, 1956 Today's Date: 05/17/2023  History of Present Illness  Pt is a 67 y.o. female presenting 5/28 with snake (copperhead) bite to DIP joint first ray of R hand. Provided antivenin. PMH significant for COPD, MDD, GAD, Graves dx, Hep C, HTN, HLD.   Clinical Impression  Patient evaluated by Physical Therapy with no further acute PT needs identified. All education has been completed and the patient has no further questions. At the time of PT eval pt was mobilizing independently without an AD. She appeared to be slightly unsteady at times but able to recover without assistance. Outpatient PT for higher level balance training may be helpful to decrease risk for falls in the future, as pt reports she occasionally falls at home.  See below for any follow-up Physical Therapy or equipment needs. PT is signing off. Thank you for this referral.        Recommendations for follow up therapy are one component of a multi-disciplinary discharge planning process, led by the attending physician.  Recommendations may be updated based on patient status, additional functional criteria and insurance authorization.  Follow Up Recommendations       Assistance Recommended at Discharge PRN  Patient can return home with the following  A little help with walking and/or transfers;Assistance with cooking/housework;Assist for transportation    Equipment Recommendations None recommended by PT  Recommendations for Other Services       Functional Status Assessment Patient has had a recent decline in their functional status and demonstrates the ability to make significant improvements in function in a reasonable and predictable amount of time.     Precautions / Restrictions Precautions Precautions: Fall Precaution Comments: baseline balance deficits Restrictions Weight Bearing Restrictions: No       Mobility  Bed Mobility Overal bed mobility: Independent                  Transfers Overall transfer level: Modified independent Equipment used: None                    Ambulation/Gait Ambulation/Gait assistance: Modified independent (Device/Increase time) Gait Distance (Feet): 400 Feet Assistive device: None Gait Pattern/deviations: WFL(Within Functional Limits) Gait velocity: Decreased Gait velocity interpretation: <1.31 ft/sec, indicative of household ambulator   General Gait Details: Pt ambulating well overall. Pt with 1 small side step LOB initially but able to recover without assistance. Pt attributes this to "just waking up". No other LOB noted throughout gait training.  Stairs Stairs: Yes Stairs assistance: Modified independent (Device/Increase time) Stair Management: One rail Right, Alternating pattern, Forwards Number of Stairs: 10 General stair comments: Pt completed stairs well without difficulty or need for assistance.  Wheelchair Mobility    Modified Rankin (Stroke Patients Only)       Balance Overall balance assessment: Mild deficits observed, not formally tested                                           Pertinent Vitals/Pain Pain Assessment Pain Assessment: Faces Faces Pain Scale: Hurts a little bit Pain Location: R hand with mobility/ROM Pain Descriptors / Indicators: Aching, Sore, Tender Pain Intervention(s): Limited activity within patient's tolerance, Monitored during session, Repositioned    Home Living Family/patient expects to be discharged to:: Private residence Living Arrangements: Parent (caregiver to 2 y.o. stepfather) Available Help  at Discharge: Family (sisters, daughter) Type of Home: House Home Access: Stairs to enter   Entrance Stairs-Number of Steps: 1 Alternate Level Stairs-Number of Steps: Once in the house, 5 stair up to main level or 10 down to basement Home Layout: Two level;Other  (Comment) (bed in basement; bath on main level) Home Equipment:  (shower chair for step father but she does not use)      Prior Function Prior Level of Function : Independent/Modified Independent;Driving             Mobility Comments: No AD, endorses falls at home due to baseline balance deficits ADLs Comments: likes to work in her garden     Higher education careers adviser   Dominant Hand: Right    Extremity/Trunk Assessment   Upper Extremity Assessment Upper Extremity Assessment: RUE deficits/detail RUE Deficits / Details: Snake bite to R thumb and edema throughout hand and wrist. See OT eval for details.    Lower Extremity Assessment Lower Extremity Assessment: Overall WFL for tasks assessed    Cervical / Trunk Assessment Cervical / Trunk Assessment: Normal;Other exceptions Cervical / Trunk Exceptions: Forward head posture with rounded shoulders  Communication   Communication: No difficulties  Cognition Arousal/Alertness: Awake/alert Behavior During Therapy: WFL for tasks assessed/performed Overall Cognitive Status: Within Functional Limits for tasks assessed                                          General Comments General comments (skin integrity, edema, etc.): VSS    Exercises     Assessment/Plan    PT Assessment Patient does not need any further PT services  PT Problem List         PT Treatment Interventions      PT Goals (Current goals can be found in the Care Plan section)  Acute Rehab PT Goals Patient Stated Goal: Home ASAP PT Goal Formulation: All assessment and education complete, DC therapy    Frequency       Co-evaluation               AM-PAC PT "6 Clicks" Mobility  Outcome Measure Help needed turning from your back to your side while in a flat bed without using bedrails?: None Help needed moving from lying on your back to sitting on the side of a flat bed without using bedrails?: None Help needed moving to and from a bed to a  chair (including a wheelchair)?: None Help needed standing up from a chair using your arms (e.g., wheelchair or bedside chair)?: None Help needed to walk in hospital room?: None Help needed climbing 3-5 steps with a railing? : None 6 Click Score: 24    End of Session   Activity Tolerance: Patient tolerated treatment well Patient left: in bed;with call bell/phone within reach Nurse Communication: Mobility status PT Visit Diagnosis: Other abnormalities of gait and mobility (R26.89)    Time: 1610-9604 PT Time Calculation (min) (ACUTE ONLY): 8 min   Charges:   PT Evaluation $PT Eval Low Complexity: 1 Low          Conni Slipper, PT, DPT Acute Rehabilitation Services Secure Chat Preferred Office: [phone]   Marylynn Pearson 05/17/2023, 11:56 AM

## 2023-05-18 ENCOUNTER — Other Ambulatory Visit: Payer: Self-pay | Admitting: *Deleted

## 2023-05-18 DIAGNOSIS — E890 Postprocedural hypothyroidism: Secondary | ICD-10-CM

## 2023-05-23 ENCOUNTER — Other Ambulatory Visit: Payer: Self-pay | Admitting: "Endocrinology

## 2023-05-24 ENCOUNTER — Other Ambulatory Visit: Payer: Self-pay | Admitting: "Endocrinology

## 2023-05-24 DIAGNOSIS — E890 Postprocedural hypothyroidism: Secondary | ICD-10-CM

## 2023-05-26 LAB — T4, FREE: Free T4: 1.2 ng/dL (ref 0.8–1.8)

## 2023-05-26 LAB — TSH: TSH: 0.84 mIU/L (ref 0.40–4.50)

## 2023-05-29 ENCOUNTER — Ambulatory Visit: Payer: Medicare Other | Admitting: "Endocrinology

## 2023-06-01 ENCOUNTER — Encounter: Payer: Self-pay | Admitting: "Endocrinology

## 2023-06-01 ENCOUNTER — Ambulatory Visit: Payer: Medicare Other | Admitting: "Endocrinology

## 2023-06-01 VITALS — BP 80/42 | HR 56 | Ht 69.0 in | Wt 192.4 lb

## 2023-06-01 DIAGNOSIS — E890 Postprocedural hypothyroidism: Secondary | ICD-10-CM | POA: Diagnosis not present

## 2023-06-01 MED ORDER — AMLODIPINE-OLMESARTAN 5-20 MG PO TABS: 1.0000 | ORAL_TABLET | Freq: Every day | ORAL | 1 refills | Status: DC

## 2023-06-01 MED ORDER — LEVOTHYROXINE SODIUM 112 MCG PO TABS: 112.0000 ug | ORAL_TABLET | Freq: Every day | ORAL | 3 refills | Status: DC
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
## 2023-06-06 ENCOUNTER — Ambulatory Visit: Payer: Medicare Other

## 2023-07-11 ENCOUNTER — Other Ambulatory Visit (HOSPITAL_COMMUNITY): Payer: Self-pay | Admitting: Internal Medicine

## 2023-07-11 DIAGNOSIS — R9439 Abnormal result of other cardiovascular function study: Secondary | ICD-10-CM

## 2023-07-19 ENCOUNTER — Encounter (HOSPITAL_COMMUNITY): Payer: Self-pay

## 2023-07-20 ENCOUNTER — Telehealth (HOSPITAL_COMMUNITY): Payer: Self-pay | Admitting: *Deleted

## 2023-07-20 NOTE — Telephone Encounter (Signed)
Attempted to call patient regarding upcoming cardiac CT appointment. °Left message on voicemail with name and callback number ° °Merle Prescott RN Navigator Cardiac Imaging °[facility] °[phone] Office °[phone] Cell ° °

## 2023-07-21 ENCOUNTER — Ambulatory Visit (HOSPITAL_BASED_OUTPATIENT_CLINIC_OR_DEPARTMENT_OTHER)
Admission: RE | Admit: 2023-07-21 | Discharge: 2023-07-21 | Disposition: A | Discharge status: Home / Self Care | Payer: Medicare Other | Source: Ambulatory Visit | Attending: Cardiology | Admitting: Cardiology

## 2023-07-21 ENCOUNTER — Other Ambulatory Visit: Payer: Self-pay | Admitting: Cardiology

## 2023-07-21 ENCOUNTER — Ambulatory Visit (HOSPITAL_COMMUNITY): Admission: RE | Admit: 2023-07-21 | Payer: Medicare Other | Source: Ambulatory Visit

## 2023-07-21 DIAGNOSIS — I517 Cardiomegaly: Secondary | ICD-10-CM | POA: Insufficient documentation

## 2023-07-21 DIAGNOSIS — R931 Abnormal findings on diagnostic imaging of heart and coronary circulation: Secondary | ICD-10-CM

## 2023-07-21 DIAGNOSIS — I700 Atherosclerosis of aorta: Secondary | ICD-10-CM | POA: Diagnosis not present

## 2023-07-21 DIAGNOSIS — I2510 Atherosclerotic heart disease of native coronary artery without angina pectoris: Secondary | ICD-10-CM | POA: Diagnosis not present

## 2023-07-21 DIAGNOSIS — R079 Chest pain, unspecified: Secondary | ICD-10-CM | POA: Diagnosis not present

## 2023-07-21 DIAGNOSIS — R9439 Abnormal result of other cardiovascular function study: Secondary | ICD-10-CM | POA: Diagnosis present

## 2023-07-21 MED ORDER — IOHEXOL 350 MG/ML SOLN
95.0000 mL | Freq: Once | INTRAVENOUS | Status: AC | PRN
  Administered 2023-07-21: 95 mL via INTRAVENOUS

## 2023-07-21 MED ORDER — NITROGLYCERIN 0.4 MG SL SUBL
SUBLINGUAL_TABLET | SUBLINGUAL | Status: AC
  Filled 2023-07-21: qty 2

## 2023-07-21 MED ORDER — NITROGLYCERIN 0.4 MG SL SUBL
0.8000 mg | SUBLINGUAL_TABLET | SUBLINGUAL | Status: DC | PRN
  Administered 2023-07-21: 0.8 mg via SUBLINGUAL

## 2023-08-18 ENCOUNTER — Other Ambulatory Visit: Payer: Self-pay | Admitting: "Endocrinology

## 2023-11-17 ENCOUNTER — Other Ambulatory Visit: Payer: Self-pay | Admitting: Nurse Practitioner

## 2023-11-30 ENCOUNTER — Other Ambulatory Visit: Payer: Self-pay | Admitting: Medical Genetics

## 2023-12-04 ENCOUNTER — Other Ambulatory Visit (HOSPITAL_COMMUNITY)
Admission: RE | Admit: 2023-12-04 | Discharge: 2023-12-04 | Disposition: A | Discharge status: Home / Self Care | Payer: Self-pay | Source: Ambulatory Visit | Attending: Oncology | Admitting: Oncology

## 2023-12-18 LAB — GENECONNECT MOLECULAR SCREEN: Genetic Analysis Overall Interpretation: NEGATIVE

## 2024-01-11 ENCOUNTER — Ambulatory Visit (INDEPENDENT_AMBULATORY_CARE_PROVIDER_SITE_OTHER): Payer: Medicare Other | Admitting: Orthopedic Surgery

## 2024-01-11 ENCOUNTER — Encounter: Payer: Self-pay | Admitting: Orthopedic Surgery

## 2024-01-11 VITALS — BP 132/74 | HR 60 | Temp 97.6°F | Resp 17 | Ht 69.0 in | Wt 174.2 lb

## 2024-01-11 DIAGNOSIS — K219 Gastro-esophageal reflux disease without esophagitis: Secondary | ICD-10-CM

## 2024-01-11 DIAGNOSIS — I10 Essential (primary) hypertension: Secondary | ICD-10-CM | POA: Diagnosis not present

## 2024-01-11 DIAGNOSIS — E7800 Pure hypercholesterolemia, unspecified: Secondary | ICD-10-CM

## 2024-01-11 DIAGNOSIS — E890 Postprocedural hypothyroidism: Secondary | ICD-10-CM | POA: Diagnosis not present

## 2024-01-11 DIAGNOSIS — E2839 Other primary ovarian failure: Secondary | ICD-10-CM

## 2024-01-11 DIAGNOSIS — R931 Abnormal findings on diagnostic imaging of heart and coronary circulation: Secondary | ICD-10-CM

## 2024-01-11 DIAGNOSIS — Z1231 Encounter for screening mammogram for malignant neoplasm of breast: Secondary | ICD-10-CM | POA: Diagnosis not present

## 2024-01-11 DIAGNOSIS — Z720 Tobacco use: Secondary | ICD-10-CM

## 2024-01-11 DIAGNOSIS — G894 Chronic pain syndrome: Secondary | ICD-10-CM

## 2024-01-11 DIAGNOSIS — M48062 Spinal stenosis, lumbar region with neurogenic claudication: Secondary | ICD-10-CM

## 2024-01-11 DIAGNOSIS — Z1159 Encounter for screening for other viral diseases: Secondary | ICD-10-CM
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
## 2024-01-12 ENCOUNTER — Encounter: Payer: Self-pay | Admitting: Orthopedic Surgery

## 2024-01-12 LAB — TSH: TSH: 1.4 m[IU]/L (ref 0.40–4.50)

## 2024-01-12 LAB — COMPLETE METABOLIC PANEL WITH GFR
AG Ratio: 1.7 (calc) (ref 1.0–2.5)
ALT: 18 U/L (ref 6–29)
AST: 26 U/L (ref 10–35)
Albumin: 4.7 g/dL (ref 3.6–5.1)
Alkaline phosphatase (APISO): 91 U/L (ref 37–153)
BUN: 13 mg/dL (ref 7–25)
CO2: 29 mmol/L (ref 20–32)
Calcium: 10.3 mg/dL (ref 8.6–10.4)
Chloride: 103 mmol/L (ref 98–110)
Creat: 0.94 mg/dL (ref 0.50–1.05)
Globulin: 2.7 g/dL (ref 1.9–3.7)
Glucose, Bld: 87 mg/dL (ref 65–139)
Potassium: 4.8 mmol/L (ref 3.5–5.3)
Sodium: 140 mmol/L (ref 135–146)
Total Bilirubin: 0.4 mg/dL (ref 0.2–1.2)
Total Protein: 7.4 g/dL (ref 6.1–8.1)
eGFR: 67 mL/min/{1.73_m2} (ref >=60)

## 2024-01-12 LAB — LIPID PANEL
Cholesterol: 202 mg/dL — ABNORMAL HIGH (ref <200)
HDL: 72 mg/dL (ref >=50)
LDL Cholesterol (Calc): 110 mg/dL — ABNORMAL HIGH
Non-HDL Cholesterol (Calc): 130 mg/dL — ABNORMAL HIGH (ref <130)
Total CHOL/HDL Ratio: 2.8 (calc) (ref <5.0)
Triglycerides: 98 mg/dL (ref <150)

## 2024-01-12 LAB — CBC WITH DIFFERENTIAL/PLATELET
Absolute Lymphocytes: 1533 {cells}/uL (ref 850–3900)
Absolute Monocytes: 686 {cells}/uL (ref 200–950)
Basophils Absolute: 63 {cells}/uL (ref 0–200)
Basophils Relative: 0.9 %
Eosinophils Absolute: 70 {cells}/uL (ref 15–500)
Eosinophils Relative: 1 %
HCT: 41.5 % (ref 35.0–45.0)
Hemoglobin: 13.9 g/dL (ref 11.7–15.5)
MCH: 28.8 pg (ref 27.0–33.0)
MCHC: 33.5 g/dL (ref 32.0–36.0)
MCV: 86.1 fL (ref 80.0–100.0)
MPV: 11.7 fL (ref 7.5–12.5)
Monocytes Relative: 9.8 %
Neutro Abs: 4648 {cells}/uL (ref 1500–7800)
Neutrophils Relative %: 66.4 %
Platelets: 227 10*3/uL (ref 140–400)
RBC: 4.82 10*6/uL (ref 3.80–5.10)
RDW: 13.9 % (ref 11.0–15.0)
Total Lymphocyte: 21.9 %
WBC: 7 10*3/uL (ref 3.8–10.8)

## 2024-01-12 LAB — SEDIMENTATION RATE: Sed Rate: 19 mm/h (ref 0–30)

## 2024-01-12 LAB — C-REACTIVE PROTEIN: CRP: <3 mg/L (ref <8.0)

## 2024-01-17 ENCOUNTER — Other Ambulatory Visit: Payer: Self-pay | Admitting: Orthopedic Surgery

## 2024-01-17 DIAGNOSIS — G43909 Migraine, unspecified, not intractable, without status migrainosus: Secondary | ICD-10-CM

## 2024-01-17 DIAGNOSIS — G43109 Migraine with aura, not intractable, without status migrainosus: Secondary | ICD-10-CM

## 2024-01-30 DIAGNOSIS — M5416 Radiculopathy, lumbar region: Secondary | ICD-10-CM | POA: Diagnosis not present

## 2024-01-30 DIAGNOSIS — M545 Low back pain, unspecified: Secondary | ICD-10-CM | POA: Diagnosis not present

## 2024-02-08 ENCOUNTER — Ambulatory Visit (INDEPENDENT_AMBULATORY_CARE_PROVIDER_SITE_OTHER): Payer: Medicare Other | Admitting: Orthopedic Surgery

## 2024-02-08 ENCOUNTER — Encounter: Payer: Self-pay | Admitting: Orthopedic Surgery

## 2024-02-08 VITALS — BP 138/70 | HR 63 | Temp 98.2°F | Resp 20 | Ht 69.0 in | Wt 188.0 lb

## 2024-02-08 DIAGNOSIS — Z720 Tobacco use: Secondary | ICD-10-CM

## 2024-02-08 DIAGNOSIS — R931 Abnormal findings on diagnostic imaging of heart and coronary circulation: Secondary | ICD-10-CM

## 2024-02-08 DIAGNOSIS — I10 Essential (primary) hypertension: Secondary | ICD-10-CM | POA: Diagnosis not present

## 2024-02-08 DIAGNOSIS — M48062 Spinal stenosis, lumbar region with neurogenic claudication: Secondary | ICD-10-CM

## 2024-02-08 DIAGNOSIS — Z1211 Encounter for screening for malignant neoplasm of colon: Secondary | ICD-10-CM | POA: Diagnosis not present

## 2024-02-08 MED ORDER — VARENICLINE TARTRATE 1 MG PO TABS
1.0000 mg | ORAL_TABLET | Freq: Two times a day (BID) | ORAL | 0 refills | Status: DC
Start: 2024-02-08 — End: 2024-05-17

## 2024-02-08 NOTE — Patient Instructions (Signed)
Nizoral shampoo for itching scalp  Schedule colonoscopy sometime this year
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
## 2024-02-16 ENCOUNTER — Ambulatory Visit (HOSPITAL_COMMUNITY)
Admission: RE | Admit: 2024-02-16 | Discharge: 2024-02-16 | Disposition: A | Discharge status: Home / Self Care | Payer: Medicare Other | Source: Ambulatory Visit | Attending: Orthopedic Surgery | Admitting: Orthopedic Surgery

## 2024-02-16 ENCOUNTER — Other Ambulatory Visit: Payer: Self-pay | Admitting: Nurse Practitioner

## 2024-02-16 ENCOUNTER — Other Ambulatory Visit: Payer: Self-pay

## 2024-02-16 ENCOUNTER — Encounter (HOSPITAL_COMMUNITY): Payer: Self-pay

## 2024-02-16 ENCOUNTER — Encounter: Payer: Self-pay | Admitting: Orthopedic Surgery

## 2024-02-16 DIAGNOSIS — Z780 Asymptomatic menopausal state: Secondary | ICD-10-CM | POA: Diagnosis not present

## 2024-02-16 DIAGNOSIS — E2839 Other primary ovarian failure: Secondary | ICD-10-CM | POA: Diagnosis present

## 2024-02-16 DIAGNOSIS — Z1231 Encounter for screening mammogram for malignant neoplasm of breast: Secondary | ICD-10-CM | POA: Diagnosis not present

## 2024-02-16 DIAGNOSIS — M85851 Other specified disorders of bone density and structure, right thigh: Secondary | ICD-10-CM | POA: Diagnosis not present

## 2024-02-20 DIAGNOSIS — H25812 Combined forms of age-related cataract, left eye: Secondary | ICD-10-CM | POA: Diagnosis not present

## 2024-02-20 DIAGNOSIS — H25811 Combined forms of age-related cataract, right eye: Secondary | ICD-10-CM | POA: Diagnosis not present

## 2024-02-20 DIAGNOSIS — M546 Pain in thoracic spine: Secondary | ICD-10-CM | POA: Diagnosis not present

## 2024-02-21 ENCOUNTER — Telehealth: Payer: Self-pay | Admitting: Cardiology

## 2024-02-21 NOTE — Telephone Encounter (Signed)
   Pre-operative Risk Assessment    Patient Name: Penny Cox  DOB: 1956-01-14 MRN: [id number]   Date of last office visit: none since 2011 Date of next office visit: 03/11/2024   Request for Surgical Clearance    Procedure:   cataract extraction by PE, IOL-left  and right TBD  Date of Surgery:  Clearance 04/01/24                                Surgeon:  Dr. Janyth Pupa B. Grissom  Surgeon's Group or Practice Name:  [facility] number:  [phone] Fax number:  [phone]   Type of Clearance Requested:   - Medical    Type of Anesthesia:   IV sedation   Additional requests/questions:    Signed, Royann Shivers   02/21/2024, 9:54 AM

## 2024-02-22 DIAGNOSIS — M5416 Radiculopathy, lumbar region: Secondary | ICD-10-CM | POA: Diagnosis not present

## 2024-02-27 DIAGNOSIS — Z79899 Other long term (current) drug therapy: Secondary | ICD-10-CM | POA: Diagnosis not present

## 2024-02-27 DIAGNOSIS — M5416 Radiculopathy, lumbar region: Secondary | ICD-10-CM | POA: Diagnosis not present

## 2024-02-27 DIAGNOSIS — M546 Pain in thoracic spine: Secondary | ICD-10-CM | POA: Diagnosis not present

## 2024-02-28 DIAGNOSIS — M546 Pain in thoracic spine: Secondary | ICD-10-CM | POA: Diagnosis not present

## 2024-03-10 DIAGNOSIS — R931 Abnormal findings on diagnostic imaging of heart and coronary circulation: Secondary | ICD-10-CM | POA: Insufficient documentation

## 2024-03-10 NOTE — Progress Notes (Unsigned)
 Cardiology Office Note:  .   Date:  03/11/2024  ID:  [name], DOB 12-02-1956, MRN [id number] PCP: Octavia Heir, NP ; Salli Real, NMD [facility] Providers Cardiologist:  Bryan Lemma, MD     Chief Complaint  Patient presents with   New Patient (Initial Visit)    Interventional Cardiology Consult with abnormal Coronary CTA for evaluation of chest pain    Patient Profile: .     Penny Cox is an obese 68 y.o. female ~former smoker (at least 1 PPD) with a PMH notable for Hypothyroidism (Following up Radioiodine Therapy), HTN, HLD with elevated Coronary Calcium Score with 1195 with potential FFR ct positive CAD on Coronary CTA who presents here for Interventional Cardiology evaluation at the request of Thermon Leyland., MD.  I had a copperhead snake bite in May 2024 She also has significant lumbar radicular pain-managed by chronic pain clinic.    Penny Cox was seen by Dr. Rosita Kea from [facility] in June 2024 with complaints of chest pain and palpitations.  Chest pain radiated to left arm described as sharp.  Occurring monthly.  She also noted exertional dyspnea => because of equivocal stress test done the preceding year, aorta Coronary CTA mild 2D Echo. => She was then seen in follow-up December 26, 2023 by Dr. Mercy Riding.  She is still noticing left-sided chest pain rating to the arm described as sharp symptoms.  Also associated with exertional dyspnea => she was referred for Interventional Cardiology consultation for cardiac catheterization and possible PCI. -> Treated with amlodipine 5 mg daily, losartan 45 daily and pravastatin 40 mm daily.  Subjective  Discussed the use of AI scribe software for clinical note transcription with the patient, who gave verbal consent to proceed.  History of Present Illness   Penny Cox "Penny Cox" is a 68 year old female chronic smoker with Hypertension, and Hyperlipidemia who was referred for Interventional  Cardiology Consultation with C/C of Chest Pain and abnormal stress test / Abnormal Coronary CT Angiography results. She was referred by Dr. Mercy Riding [facility]) for evaluation of her coronary artery disease.  She experiences chest pain and heart palpitations described as 'jumping and fluttering', with pain radiating down the back of her upper arms. These symptoms occur even at rest and do not significantly worsen with exertion. She had a stress test some time ago that was noted to be abnormal. Her past cardiac workup includes a CT scan of the heart performed in August of the previous year, which showed significant blockage in the right coronary artery. She has been referred multiple times for further evaluation, but there were issues with the referral process.  Her current medications include amlodipine and olmesartan. She has used nitroglycerin once or twice. She is not on metoprolol or carvedilol. Her heart rate is often slow, with a recorded rate of 46 during a recent back injection. She experiences heart racing about once a week, depending on her activity. No swelling in her legs, no shortness of breath when lying flat, and no waking up short of breath.  She has a history of stage 3 kidney disease, which was later reported as normal. She is currently taking Chantix to quit smoking and reports her blood pressure has been as high as 225. She believes she has 'white coat syndrome'. Her cholesterol was last checked in January, and she has been on pravastatin for a long time.   She has a history of scoliosis, which has been causing significant  pain in her lower back, both legs, and arms since a week before Christmas. She describes her whole body as being in constant pain. She received a back injection once, which was ineffective. She reports episodes of feeling 'shocked', where her body tenses up and then slowly relaxes over a few hours. These episodes are painful and have occurred a few  times. She can sometimes prevent them by lying down when she feels them coming on.     Cardiovascular ROS: positive for - chest pain, dyspnea on exertion, irregular heartbeat, palpitations, and shortness of breath negative for - edema, orthopnea, paroxysmal nocturnal dyspnea, or syncope/syncope or TIA/fugax, claudication    Objective   Current Meds  Medication Sig   allopurinol (ZYLOPRIM) 300 MG tablet Take 300 mg by mouth at bedtime.   amLODipine-olmesartan (AZOR) 5-20 MG tablet Take 1 tablet by mouth daily.   Ascorbic Acid (VITAMIN C) 500 MG CAPS Take 1 capsule by mouth daily.   aspirin EC 81 MG tablet Take 1 tablet (81 mg total) by mouth daily. Swallow whole.   baclofen (LIORESAL) 20 MG tablet Take 20 mg by mouth every 6 (six) hours.   Biotin 5 MG TABS Take 2 tablets by mouth at bedtime.   Buprenorphine HCl (BELBUCA) 600 MCG FILM Place 600 mcg inside cheek in the morning and at bedtime. Twice a day   Calcium Carbonate-Vitamin D (CALCIUM-VITAMIN D PO) Take 1 tablet by mouth daily.   cetirizine (ZYRTEC) 10 MG tablet Take 1 tablet (10 mg total) by mouth at bedtime.   Cholecalciferol (VITAMIN D3) 5000 units CAPS Take 5,000 Units by mouth daily.   clopidogrel (PLAVIX) 75 MG tablet Take 1 tablet (75 mg total) by mouth daily. Take 3 tablets the first day of starting medication   Cyanocobalamin (B-12) 50 MCG TABS Take 1 tablet by mouth daily.   diclofenac Sodium (VOLTAREN) 1 % GEL Apply 1 application. topically as directed.   doxepin (SINEQUAN) 150 MG capsule Take 150 mg by mouth at bedtime.   gabapentin (NEURONTIN) 600 MG tablet Take 600 mg by mouth 3 (three) times daily.   Galcanezumab-gnlm (EMGALITY) 120 MG/ML SOAJ 1 (ONE) SOLUTION AUTO-INJECTO SOLUTION AUTO-INJECTOR MONTHLY   Glucosamine-Chondroitin-MSM 500-200-150 MG TABS Take 1 tablet by mouth 2 (two) times daily.   levothyroxine (SYNTHROID) 112 MCG tablet TAKE 1 TABLET BY MOUTH EVERY DAY BEFORE BREAKFAST   MELATONIN PO Take 15 mg by  mouth at bedtime.   Multiple Vitamin (MULTIVITAMIN ADULT PO) Take 1 tablet by mouth daily.   nitroGLYCERIN (NITROLINGUAL) 0.4 MG/SPRAY spray Place 1 spray under the tongue every 5 (five) minutes x 3 doses as needed for chest pain.   pantoprazole (PROTONIX) 40 MG tablet Take 1 tablet (40 mg total) by mouth daily.   polyethylene glycol (MIRALAX / GLYCOLAX) 17 g packet Take 17 g by mouth daily.   rosuvastatin (CRESTOR) 40 MG tablet Take 1 tablet (40 mg total) by mouth daily.   traZODone (DESYREL) 100 MG tablet Take 100 mg by mouth at bedtime.   Turmeric (QC TUMERIC COMPLEX PO) Take by mouth.   varenicline (CHANTIX CONTINUING MONTH PAK) 1 MG tablet Take 1 tablet (1 mg total) by mouth 2 (two) times daily.   Vitamin E 67 MG (100 UNIT) TABS Take 1 tablet by mouth 2 (two) times daily.   [DISCONTINUED] omeprazole (PRILOSEC) 20 MG capsule Take 20 mg by mouth 2 (two) times daily before a meal.    [DISCONTINUED] pravastatin (PRAVACHOL) 40 MG tablet Take 40 mg by  mouth daily.   SH: 1 pack-a-day smoker. FH: Father had CVA.  Mother and brother with HTN, Brother also has HLD and DM-2 along with CAD; MGM & PGF - died from CAD issues (Family history is significant for heart disease, with her brother, maternal grandfather, and paternal grandmother having had heart conditions. Her grandmother died during surgery for a heart issue.)  Allergies: Labetalol, Butrans, Lyrica  Studies Reviewed: Marland Kitchen   EKG Interpretation Date/Time:  Monday March 11 2024 09:50:36 EDT Ventricular Rate:  55 PR Interval:  152 QRS Duration:  94 QT Interval:  458 QTC Calculation: 438 R Axis:   67  Text Interpretation: Sinus bradycardia Nonspecific T wave abnormality When compared with ECG of 16-May-2023 14:39, PREVIOUS ECG IS PRESENT Confirmed by Bryan Lemma ([id number]) on 03/11/2024 9:57:23 AM   [facility] Lexiscan Myoview 08/29/2022: Difficult studies interpreted.  Pronounced subdiaphragmatic radiotracer uptake.  Small to  moderate size mild severity mid lateral perfusion defect most likely suggestive of breast attenuation.  Normal EF. 24-hour Holter monitor (August 2023): Short runs of PAT-2 open longest was 14 beats with a rate of 127 to 154 bpm.  1.1% PACs 0.7% PVCs. TTE 12/25/2023: Normal EF of 55 to 60%.  Normal diastolic parameters for age, but markedly dilated left atrium.  Nodular AoV calcification with mild AI but no stenosis.  LA more dilated. [facility] Coronary CTA 07/21/23: CAC score 1195.  TPV 902 mm cube-extensive.  Severe RCA (70 to 99% (FFR ct 0.72), mid LAD 30 to 49%, moderate LCx-LM 50 to 69%.  Labs from [facility]: 01/11/2024: TC 202, TG 98, HDL 70, LDL 110.  Hgb 13.9,Cr 0.94, K+ 4.8.  Risk Assessment/Calculations:         Physical Exam:   VS:  BP (!) 150/64 (BP Location: Right Arm, Patient Position: Sitting, Cuff Size: Normal)   Pulse (!) 55   Ht 5\' 9"  (1.753 m)   Wt 186 lb (84.4 kg)   SpO2 96%   BMI 27.47 kg/m    - will monitor - anxious today Wt Readings from Last 3 Encounters:  03/11/24 186 lb (84.4 kg)  02/08/24 188 lb (85.3 kg)  01/11/24 174 lb 3.2 oz (79 kg)    GEN: Well nourished, well developed in no acute distress; healthy appearing; still smells of cigarette smoke NECK: No JVD; harsh left-sided carotid bruit CARDIAC: Normal S1, S2; RRR, no murmurs, rubs, gallops RESPIRATORY:  Clear to auscultation without rales, wheezing or rhonchi ; nonlabored, good air movement. ABDOMEN: Soft, non-tender, non-distended EXTREMITIES:  No edema; No deformity     ASSESSMENT AND PLAN: .    Problem List Items Addressed This Visit       Cardiology Problems   Atypical angina (HCC) (Chronic)   Atypical sounding chest discomfort symptoms which may or may not have been exertional in nature.  However there is no evidence of stress test and now Coronary CTA suggest that there is coronary etiology. Plan: Continue amlodipine-on losartan with plans potentially titrate up CCB for antianginal  benefit. -Will plan for heart catheterization on March 22, 2024, to confirm findings and possible PCI indicated.       Relevant Medications   rosuvastatin (CRESTOR) 40 MG tablet   aspirin EC 81 MG tablet   Other Relevant Orders   Basic metabolic panel   CBC   Hyperlipidemia with target LDL less than 70 (Chronic)   Most recent labs from January showed LDL of 110 with total cholesterol of 202.  Clearly not at  goal on current statin-will convert from pravastatin to rosuvastatin 40 mg daily. -DC pravastatin 40 mg, start rosuvastatin 40 mg daily.      Relevant Medications   rosuvastatin (CRESTOR) 40 MG tablet   aspirin EC 81 MG tablet   Other Relevant Orders   EKG 12-Lead (Completed)   Basic metabolic panel   CBC   HYPERTENSION (Chronic)   Blood pressure is elevated today.  Could be related to stress.  He is on amlodipine-HCTZ 5-20 mg daily.  Low threshold to titrate the amlodipine portion up to 10 mg.  With the risk already 80, with his beta-blocker.  Suspected white coat hypertension to be evaluated during catheterization. - Reassess blood pressure during heart catheterization. - Consider amlodipine dosage adjustment based on findings.      Relevant Medications   rosuvastatin (CRESTOR) 40 MG tablet   aspirin EC 81 MG tablet   Other Relevant Orders   EKG 12-Lead (Completed)   Basic metabolic panel   CBC     Other   Abnormal cardiac CT angiography - Primary (Chronic)   Coronary CTA shows relatively normal left-sided vessels, but significant disease in the RCA which goes along with potential inferolateral ischemia on Myoview in 2023. FFR ct confirms severe mid RCA stenoses as likely potential etiology for atypical angina palpitation symptoms.  Referred for Interventional Cardiology Consultation to Discuss Cardiac Catheterization -Plan Left Heart Catheterization with Coronary Angiography and Possible Change Coronary Intervention on March 22, 2024,  - Switch pravastatin to  rosuvastatin 40 mg for lipid management. - Order pre-cath labs for renal function and CBC. - Discussed risks and benefits of heart catheterization, including potential complications. - Discussed post-procedure antiplatelet therapy with aspirin and Plavix if stents placed. -With only single-vessel disease noted on coronary CT, will load with 300 mg Plavix followed by 75 mg Plavix daily and and continue aspirin 81 mg daily  See Informed Consent below.      Relevant Orders   EKG 12-Lead (Completed)   Basic metabolic panel   CBC   Left carotid bruit (Chronic)   Unilateral greater on exam.  Has history of CAD and PAD-will check carotid artery Dopplers and titrate her CV medications. -Plan-Carotid Dopplers after cath      Relevant Orders   Basic metabolic panel   CBC   VAS US CAROTID   Palpitations   We will see how the palpitations pain following cath and possible PCI.  If still present would consider repeating monitor.  We resting heart rate of 55 bpm, I am leery of starting on a rate control agent.      Relevant Orders   Basic metabolic panel   CBC   Stage 3a chronic kidney disease (CKD) (HCC) (Chronic)   Most recent creatinine is less than 1.  No major issue. Reassess labs prior to cardiac catheterization. Ensure adequate hydration.  Post cath      TOBACCO USER (Chronic)   We talked briefly about the importance of smoking cessation.  She is in the process of trying to quit.  On Chantix currently-ordered by PCP.      Relevant Orders   EKG 12-Lead (Completed)   Basic metabolic panel   CBC    Post-procedure follow-up to ensure continuity of care. Follow-Up: Return in about 3 weeks (around 04/01/2024) for Post cath visit with APP, 3-4 month follow-up, Routine follow up with me.- Schedule post-cath follow-up with APP within weeks. - Plan cardiologist follow-up in months if not seen immediately post-cath.  At  the completion of our discussion, the patient indicated that she  would like to transfer care to [facility]  Recording duration: 28 minutes        Informed Consent   Shared Decision Making/Informed Consent The risks [stroke (1 in 1000), death (1 in 1000), kidney failure [usually temporary] (1 in 500), bleeding (1 in 200), allergic reaction [possibly serious] (1 in 200)], benefits (diagnostic support and management of coronary artery disease) and alternatives of a cardiac catheterization were discussed in detail with Ms. Kallenbach and she is willing to proceed.       I spent 62 minutes in the care of [name] today including reviewing outside labs from PCP via [facility] (2 min), reviewing studies (from Dr. [facility]cy Riding - [facility] -- 6 min), reviewing outside studies (From Dr. Mercy Riding - Myoview, Echo & Monitor results - 7 min), face to face time discussing treatment options (28), reviewing records from Dr. Mercy Riding (scanned noted) (6 min), 13 min dictating, and documenting in the encounter.    Signed, Marykay Lex, MD, MS Bryan Lemma, M.D., M.S. Interventional Cardiologist  [facility]  Pager # [phone] Phone # [phone] [address] [place], [place] [address]

## 2024-03-10 NOTE — H&P (View-Only) (Signed)
 Cardiology Office Note:  .   Date:  03/11/2024  ID:  Penny Cox, DOB [birth year], MRN [id number] PCP: Octavia Heir, NP ; Penny Cox, NMD [facility] Providers Cardiologist:  Bryan Lemma, MD     Chief Complaint  Patient presents with   New Patient (Initial Visit)    Interventional Cardiology Consult with abnormal Coronary CTA for evaluation of chest pain    Patient Profile: .     Penny Cox is an obese 68 y.o. female ~former smoker (at least 1 PPD) with a PMH notable for Hypothyroidism (Following up Radioiodine Therapy), HTN, HLD with elevated Coronary Calcium Score with 1195 with potential FFR ct positive CAD on Coronary CTA who presents here for Interventional Cardiology evaluation at the request of Thermon Leyland., MD.  I had a copperhead snake bite in May 2024 She also has significant lumbar radicular pain-managed by chronic pain clinic.    Penny Cox was seen by Dr. Rosita Kea from [facility] in June 2024 with complaints of chest pain and palpitations.  Chest pain radiated to left arm described as sharp.  Occurring monthly.  She also noted exertional dyspnea => because of equivocal stress test done the preceding year, aorta Coronary CTA mild 2D Echo. => She was then seen in follow-up December 26, 2023 by Dr. Mercy Riding.  She is still noticing left-sided chest pain rating to the arm described as sharp symptoms.  Also associated with exertional dyspnea => she was referred for Interventional Cardiology consultation for cardiac catheterization and possible PCI. -> Treated with amlodipine 5 mg daily, losartan 45 daily and pravastatin 40 mm daily.  Subjective  Discussed the use of AI scribe software for clinical note transcription with the patient, who gave verbal consent to proceed.  History of Present Illness   Penny Cox "Penny Cox" is a 68 year old female chronic smoker with Hypertension, and Hyperlipidemia who was referred for Interventional  Cardiology Consultation with C/C of Chest Pain and abnormal stress test / Abnormal Coronary CT Angiography results. She was referred by Dr. Mercy Riding [facility]) for evaluation of her coronary artery disease.  She experiences chest pain and heart palpitations described as 'jumping and fluttering', with pain radiating down the back of her upper arms. These symptoms occur even at rest and do not significantly worsen with exertion. She had a stress test some time ago that was noted to be abnormal. Her past cardiac workup includes a CT scan of the heart performed in August of the previous year, which showed significant blockage in the right coronary artery. She has been referred multiple times for further evaluation, but there were issues with the referral process.  Her current medications include amlodipine and olmesartan. She has used nitroglycerin once or twice. She is not on metoprolol or carvedilol. Her heart rate is often slow, with a recorded rate of 46 during a recent back injection. She experiences heart racing about once a week, depending on her activity. No swelling in her legs, no shortness of breath when lying flat, and no waking up short of breath.  She has a history of stage 3 kidney disease, which was later reported as normal. She is currently taking Chantix to quit smoking and reports her blood pressure has been as high as 225. She believes she has 'white coat syndrome'. Her cholesterol was last checked in January, and she has been on pravastatin for a long time.   She has a history of scoliosis, which has been causing significant  pain in her lower back, both legs, and arms since a week before Christmas. She describes her whole body as being in constant pain. She received a back injection once, which was ineffective. She reports episodes of feeling 'shocked', where her body tenses up and then slowly relaxes over a few hours. These episodes are painful and have occurred a few  times. She can sometimes prevent them by lying down when she feels them coming on.     Cardiovascular ROS: positive for - chest pain, dyspnea on exertion, irregular heartbeat, palpitations, and shortness of breath negative for - edema, orthopnea, paroxysmal nocturnal dyspnea, or syncope/syncope or TIA/fugax, claudication    Objective   Current Meds  Medication Sig   allopurinol (ZYLOPRIM) 300 MG tablet Take 300 mg by mouth at bedtime.   amLODipine-olmesartan (AZOR) 5-20 MG tablet Take 1 tablet by mouth daily.   Ascorbic Acid (VITAMIN C) 500 MG CAPS Take 1 capsule by mouth daily.   aspirin EC 81 MG tablet Take 1 tablet (81 mg total) by mouth daily. Swallow whole.   baclofen (LIORESAL) 20 MG tablet Take 20 mg by mouth every 6 (six) hours.   Biotin 5 MG TABS Take 2 tablets by mouth at bedtime.   Buprenorphine HCl (BELBUCA) 600 MCG FILM Place 600 mcg inside cheek in the morning and at bedtime. Twice a day   Calcium Carbonate-Vitamin D (CALCIUM-VITAMIN D PO) Take 1 tablet by mouth daily.   cetirizine (ZYRTEC) 10 MG tablet Take 1 tablet (10 mg total) by mouth at bedtime.   Cholecalciferol (VITAMIN D3) 5000 units CAPS Take 5,000 Units by mouth daily.   clopidogrel (PLAVIX) 75 MG tablet Take 1 tablet (75 mg total) by mouth daily. Take 3 tablets the first day of starting medication   Cyanocobalamin (B-12) 50 MCG TABS Take 1 tablet by mouth daily.   diclofenac Sodium (VOLTAREN) 1 % GEL Apply 1 application. topically as directed.   doxepin (SINEQUAN) 150 MG capsule Take 150 mg by mouth at bedtime.   gabapentin (NEURONTIN) 600 MG tablet Take 600 mg by mouth 3 (three) times daily.   Galcanezumab-gnlm (EMGALITY) 120 MG/ML SOAJ 1 (ONE) SOLUTION AUTO-INJECTO SOLUTION AUTO-INJECTOR MONTHLY   Glucosamine-Chondroitin-MSM 500-200-150 MG TABS Take 1 tablet by mouth 2 (two) times daily.   levothyroxine (SYNTHROID) 112 MCG tablet TAKE 1 TABLET BY MOUTH EVERY DAY BEFORE BREAKFAST   MELATONIN PO Take 15 mg by  mouth at bedtime.   Multiple Vitamin (MULTIVITAMIN ADULT PO) Take 1 tablet by mouth daily.   nitroGLYCERIN (NITROLINGUAL) 0.4 MG/SPRAY spray Place 1 spray under the tongue every 5 (five) minutes x 3 doses as needed for chest pain.   pantoprazole (PROTONIX) 40 MG tablet Take 1 tablet (40 mg total) by mouth daily.   polyethylene glycol (MIRALAX / GLYCOLAX) 17 g packet Take 17 g by mouth daily.   rosuvastatin (CRESTOR) 40 MG tablet Take 1 tablet (40 mg total) by mouth daily.   traZODone (DESYREL) 100 MG tablet Take 100 mg by mouth at bedtime.   Turmeric (QC TUMERIC COMPLEX PO) Take by mouth.   varenicline (CHANTIX CONTINUING MONTH PAK) 1 MG tablet Take 1 tablet (1 mg total) by mouth 2 (two) times daily.   Vitamin E 67 MG (100 UNIT) TABS Take 1 tablet by mouth 2 (two) times daily.   [DISCONTINUED] omeprazole (PRILOSEC) 20 MG capsule Take 20 mg by mouth 2 (two) times daily before a meal.    [DISCONTINUED] pravastatin (PRAVACHOL) 40 MG tablet Take 40 mg by  mouth daily.   SH: 1 pack-a-day smoker. FH: Father had CVA.  Mother and brother with HTN, Brother also has HLD and DM-2 along with CAD; MGM & PGF - died from CAD issues (Family history is significant for heart disease, with her brother, maternal grandfather, and paternal grandmother having had heart conditions. Her grandmother died during surgery for a heart issue.)  Allergies: Labetalol, Butrans, Lyrica  Studies Reviewed: Marland Kitchen   EKG Interpretation Date/Time:  Monday March 11 2024 09:50:36 EDT Ventricular Rate:  55 PR Interval:  152 QRS Duration:  94 QT Interval:  458 QTC Calculation: 438 R Axis:   67  Text Interpretation: Sinus bradycardia Nonspecific T wave abnormality When compared with ECG of 16-May-2023 14:39, PREVIOUS ECG IS PRESENT Confirmed by Bryan Lemma ([id number]) on 03/11/2024 9:57:23 AM   [facility] Lexiscan Myoview 08/29/2022: Difficult studies interpreted.  Pronounced subdiaphragmatic radiotracer uptake.  Small to  moderate size mild severity mid lateral perfusion defect most likely suggestive of breast attenuation.  Normal EF. 24-hour Holter monitor (August 2023): Short runs of PAT-2 open longest was 14 beats with a rate of 127 to 154 bpm.  1.1% PACs 0.7% PVCs. TTE 12/25/2023: Normal EF of 55 to 60%.  Normal diastolic parameters for age, but markedly dilated left atrium.  Nodular AoV calcification with mild AI but no stenosis.  LA more dilated. [place] Coronary CTA 07/21/23: CAC score 1195.  TPV 902 mm cube-extensive.  Severe RCA (70 to 99% (FFR ct 0.72), mid LAD 30 to 49%, moderate LCx-LM 50 to 69%.  Labs from [facility]: 01/11/2024: TC 202, TG 98, HDL 70, LDL 110.  Hgb 13.9,Cr 0.94, K+ 4.8.  Risk Assessment/Calculations:         Physical Exam:   VS:  BP (!) 150/64 (BP Location: Right Arm, Patient Position: Sitting, Cuff Size: Normal)   Pulse (!) 55   Ht 5\' 9"  (1.753 m)   Wt 186 lb (84.4 kg)   SpO2 96%   BMI 27.47 kg/m    - will monitor - anxious today Wt Readings from Last 3 Encounters:  03/11/24 186 lb (84.4 kg)  02/08/24 188 lb (85.3 kg)  01/11/24 174 lb 3.2 oz (79 kg)    GEN: Well nourished, well developed in no acute distress; healthy appearing; still smells of cigarette smoke NECK: No JVD; harsh left-sided carotid bruit CARDIAC: Normal S1, S2; RRR, no murmurs, rubs, gallops RESPIRATORY:  Clear to auscultation without rales, wheezing or rhonchi ; nonlabored, good air movement. ABDOMEN: Soft, non-tender, non-distended EXTREMITIES:  No edema; No deformity     ASSESSMENT AND PLAN: .    Problem List Items Addressed This Visit       Cardiology Problems   Atypical angina (HCC) (Chronic)   Atypical sounding chest discomfort symptoms which may or may not have been exertional in nature.  However there is no evidence of stress test and now Coronary CTA suggest that there is coronary etiology. Plan: Continue amlodipine-on losartan with plans potentially titrate up CCB for antianginal  benefit. -Will plan for heart catheterization on March 22, 2024, to confirm findings and possible PCI indicated.       Relevant Medications   rosuvastatin (CRESTOR) 40 MG tablet   aspirin EC 81 MG tablet   Other Relevant Orders   Basic metabolic panel   CBC   Hyperlipidemia with target LDL less than 70 (Chronic)   Most recent labs from January showed LDL of 110 with total cholesterol of 202.  Clearly not at  goal on current statin-will convert from pravastatin to rosuvastatin 40 mg daily. -DC pravastatin 40 mg, start rosuvastatin 40 mg daily.      Relevant Medications   rosuvastatin (CRESTOR) 40 MG tablet   aspirin EC 81 MG tablet   Other Relevant Orders   EKG 12-Lead (Completed)   Basic metabolic panel   CBC   HYPERTENSION (Chronic)   Blood pressure is elevated today.  Could be related to stress.  He is on amlodipine-HCTZ 5-20 mg daily.  Low threshold to titrate the amlodipine portion up to 10 mg.  With the risk already 34, with his beta-blocker.  Suspected white coat hypertension to be evaluated during catheterization. - Reassess blood pressure during heart catheterization. - Consider amlodipine dosage adjustment based on findings.      Relevant Medications   rosuvastatin (CRESTOR) 40 MG tablet   aspirin EC 81 MG tablet   Other Relevant Orders   EKG 12-Lead (Completed)   Basic metabolic panel   CBC     Other   Abnormal cardiac CT angiography - Primary (Chronic)   Coronary CTA shows relatively normal left-sided vessels, but significant disease in the RCA which goes along with potential inferolateral ischemia on Myoview in 2023. FFR ct confirms severe mid RCA stenoses as likely potential etiology for atypical angina palpitation symptoms.  Referred for Interventional Cardiology Consultation to Discuss Cardiac Catheterization -Plan Left Heart Catheterization with Coronary Angiography and Possible Change Coronary Intervention on March 22, 2024,  - Switch pravastatin to  rosuvastatin 40 mg for lipid management. - Order pre-cath labs for renal function and CBC. - Discussed risks and benefits of heart catheterization, including potential complications. - Discussed post-procedure antiplatelet therapy with aspirin and Plavix if stents placed. -With only single-vessel disease noted on coronary CT, will load with 300 mg Plavix followed by 75 mg Plavix daily and and continue aspirin 81 mg daily  See Informed Consent below.      Relevant Orders   EKG 12-Lead (Completed)   Basic metabolic panel   CBC   Left carotid bruit (Chronic)   Unilateral greater on exam.  Has history of CAD and PAD-will check carotid artery Dopplers and titrate her CV medications. -Plan-Carotid Dopplers after cath      Relevant Orders   Basic metabolic panel   CBC   VAS US CAROTID   Palpitations   We will see how the palpitations pain following cath and possible PCI.  If still present would consider repeating monitor.  We resting heart rate of 55 bpm, I am leery of starting on a rate control agent.      Relevant Orders   Basic metabolic panel   CBC   Stage 3a chronic kidney disease (CKD) (HCC) (Chronic)   Most recent creatinine is less than 1.  No major issue. Reassess labs prior to cardiac catheterization. Ensure adequate hydration.  Post cath      TOBACCO USER (Chronic)   We talked briefly about the importance of smoking cessation.  She is in the process of trying to quit.  On Chantix currently-ordered by PCP.      Relevant Orders   EKG 12-Lead (Completed)   Basic metabolic panel   CBC    Post-procedure follow-up to ensure continuity of care. Follow-Up: Return in about 3 weeks (around 04/01/2024) for Post cath visit with APP, 3-4 month follow-up, Routine follow up with me.- Schedule post-cath follow-up with APP within weeks. - Plan cardiologist follow-up in months if not seen immediately post-cath.  At  the completion of our discussion, the patient indicated that she  would like to transfer care to [facility]  Recording duration: 28 minutes        Informed Consent   Shared Decision Making/Informed Consent The risks [stroke (1 in 1000), death (1 in 1000), kidney failure [usually temporary] (1 in 500), bleeding (1 in 200), allergic reaction [possibly serious] (1 in 200)], benefits (diagnostic support and management of coronary artery disease) and alternatives of a cardiac catheterization were discussed in detail with Ms. Shaffer and she is willing to proceed.       I spent 62 minutes in the care of Penny Cox today including reviewing outside labs from PCP via [facility] (2 min), reviewing studies (from Dr. [facility]cy Riding [facility] -- 6 min), reviewing outside studies (From Dr. Mercy Riding - Myoview, Echo & Monitor results - 7 min), face to face time discussing treatment options (28), reviewing records from Dr. Mercy Riding (scanned noted) (6 min), 13 min dictating, and documenting in the encounter.    Signed, Marykay Lex, MD, MS Bryan Lemma, M.D., M.S. Interventional Cardiologist  [facility]  Pager # [phone] Phone # [phone] [place], [place] [address]

## 2024-03-11 ENCOUNTER — Encounter: Payer: Self-pay | Admitting: Cardiology

## 2024-03-11 ENCOUNTER — Ambulatory Visit: Payer: Medicare Other | Attending: Cardiology | Admitting: Cardiology

## 2024-03-11 ENCOUNTER — Other Ambulatory Visit: Payer: Self-pay | Admitting: Orthopedic Surgery

## 2024-03-11 VITALS — BP 150/64 | HR 55 | Ht 69.0 in | Wt 186.0 lb

## 2024-03-11 DIAGNOSIS — R0989 Other specified symptoms and signs involving the circulatory and respiratory systems: Secondary | ICD-10-CM | POA: Diagnosis not present

## 2024-03-11 DIAGNOSIS — R931 Abnormal findings on diagnostic imaging of heart and coronary circulation: Secondary | ICD-10-CM

## 2024-03-11 DIAGNOSIS — I2089 Other forms of angina pectoris: Secondary | ICD-10-CM | POA: Diagnosis not present

## 2024-03-11 DIAGNOSIS — E785 Hyperlipidemia, unspecified: Secondary | ICD-10-CM

## 2024-03-11 DIAGNOSIS — F17200 Nicotine dependence, unspecified, uncomplicated: Secondary | ICD-10-CM | POA: Diagnosis not present

## 2024-03-11 DIAGNOSIS — J3089 Other allergic rhinitis: Secondary | ICD-10-CM

## 2024-03-11 DIAGNOSIS — R002 Palpitations: Secondary | ICD-10-CM | POA: Diagnosis not present

## 2024-03-11 DIAGNOSIS — N1831 Chronic kidney disease, stage 3a: Secondary | ICD-10-CM

## 2024-03-11 DIAGNOSIS — I10 Essential (primary) hypertension: Secondary | ICD-10-CM

## 2024-03-11 DIAGNOSIS — R0789 Other chest pain: Secondary | ICD-10-CM

## 2024-03-11 MED ORDER — ROSUVASTATIN CALCIUM 40 MG PO TABS: 40.0000 mg | ORAL_TABLET | Freq: Every day | ORAL | 3 refills | Status: AC

## 2024-03-11 MED ORDER — CLOPIDOGREL BISULFATE 75 MG PO TABS: 75.0000 mg | ORAL_TABLET | Freq: Every day | ORAL | 3 refills | Status: AC

## 2024-03-11 MED ORDER — CETIRIZINE HCL 10 MG PO TABS
10.0000 mg | ORAL_TABLET | Freq: Every day | ORAL | 11 refills | Status: AC
Start: 2024-03-11 — End: ?

## 2024-03-11 MED ORDER — PANTOPRAZOLE SODIUM 40 MG PO TBEC: 40.0000 mg | DELAYED_RELEASE_TABLET | Freq: Every day | ORAL | 1 refills | Status: DC

## 2024-03-11 MED ORDER — ASPIRIN 81 MG PO TBEC: 81.0000 mg | DELAYED_RELEASE_TABLET | Freq: Every day | ORAL | Status: DC

## 2024-03-11 NOTE — Assessment & Plan Note (Addendum)
 We talked briefly about the importance of smoking cessation.  She is in the process of trying to quit.  On Chantix currently-ordered by PCP.

## 2024-03-11 NOTE — Assessment & Plan Note (Addendum)
 Unilateral greater on exam.  Has history of CAD and PAD-will check carotid artery Dopplers and titrate her CV medications. -Plan-Carotid Dopplers after cath

## 2024-03-11 NOTE — Assessment & Plan Note (Signed)
 Most recent creatinine is less than 1.  No major issue. Reassess labs prior to cardiac catheterization. Ensure adequate hydration.  Post cath

## 2024-03-11 NOTE — Assessment & Plan Note (Signed)
 Blood pressure is elevated today.  Could be related to stress.  He is on amlodipine-HCTZ 5-20 mg daily.  Low threshold to titrate the amlodipine portion up to 10 mg.  With the risk already 98, with his beta-blocker.  Suspected white coat hypertension to be evaluated during catheterization. - Reassess blood pressure during heart catheterization. - Consider amlodipine dosage adjustment based on findings.

## 2024-03-11 NOTE — Assessment & Plan Note (Signed)
 Atypical sounding chest discomfort symptoms which may or may not have been exertional in nature.  However there is no evidence of stress test and now Coronary CTA suggest that there is coronary etiology. Plan: Continue amlodipine-on losartan with plans potentially titrate up CCB for antianginal benefit. -Will plan for heart catheterization on March 22, 2024, to confirm findings and possible PCI indicated.

## 2024-03-11 NOTE — Patient Instructions (Addendum)
 Medication Instructions:  Stop Atorvastatin  Stop Pravastatin  Stop taking Prilosec  Start Rosuvastatin  40 mg  daily   Start Aspirin 81 mg daily Start taking Pantoprazole  Start taking Clopidogrel  - the first day  take 4 tablets ( equal 300 mg) there after take 1 tablet ( 75 mg) daily   *If you need a refill on your cardiac medications before your next appointment, please call your pharmacy*   Lab Work: CBC BMP  If you have labs (blood work) drawn today and your tests are completely normal, you will receive your results only by: MyChart Message (if you have MyChart) OR A paper copy in the mail If you have any lab test that is abnormal or we need to change your treatment, we will call you to review the results.   Testing/Procedures:    Schedule  a carotid doppler Your physician has requested that you have a carotid duplex. This test is an ultrasound of the carotid arteries in your neck. It looks at blood flow through these arteries that supply the brain with blood. Allow one hour for this exam. There are no restrictions or special instructions.   Will be schedule at [facility] - Cath Lab Your physician has requested that you have a cardiac catheterization. Cardiac catheterization is used to diagnose and/or treat various heart conditions. Doctors may recommend this procedure for a number of different reasons. The most common reason is to evaluate chest pain. Chest pain can be a symptom of coronary artery disease (CAD), and cardiac catheterization can show whether plaque is narrowing or blocking your heart's arteries. This procedure is also used to evaluate the valves, as well as measure the blood flow and oxygen levels in different parts of your heart. Please follow instruction sheet, as given.    Follow-Up: At [facility], you and your health needs are our priority.  As part of our continuing mission to provide you with exceptional heart care, we have created designated  Provider Care Teams.  These Care Teams include your primary Cardiologist (physician) and Advanced Practice Providers (APPs -  Physician Assistants and Nurse Practitioners) who all work together to provide you with the care you need, when you need it.     Your next appointment:   3 week(s)  The format for your next appointment:   In Person  Provider:    With APP   and then 2 months with Dr Rutherford Limerick 2 months   Other Instructions    [facility] A DEPT OF [place]. [facility] AT [facility] [address] [place] [address] Dept: [phone] Loc: [phone]  Kayona Foor  03/11/2024  You are scheduled for a Cardiac Catheterization on Friday, April 4 with Dr. Bryan Lemma.  1. Please arrive at the [facility] (Main Entrance A) at [facility]: [address] [place], [place] [address] at 7:00 AM (This time is 2 hour(s) before your procedure to ensure your preparation).   Free valet parking service is available. You will check in at ADMITTING. The support person will be asked to wait in the waiting room.  It is OK to have someone drop you off and come back when you are ready to be discharged.    Special note: Every effort is made to have your procedure done on time. Please understand that emergencies sometimes delay scheduled procedures.  2. Diet: Do not eat solid foods after midnight.  The patient may  have clear liquids until 5am upon the day of the procedure.  3. Labs: You will need to have blood drawn CBC,BMP  on , March 28 at [facility] [address], [place]  Open: 8am - 5pm (Lunch 12:30 - 1:30)   Phone: [phone]. You do not need to be fasting.  4. Medication instructions in preparation for your procedure:   Contrast Allergy: No Do not take  Amlodipine - olmesartan  ( AZOR)  the day of the procedure   On the morning of your procedure, take your Aspirin 81 mg and  Plavix/Clopidogrel and any morning medicines NOT listed above.  You may use sips of water.  5. Plan to go home the same day, you will only stay overnight if medically necessary. 6. Bring a current list of your medications and current insurance cards. 7. You MUST have a responsible person to drive you home. 8. Someone MUST be with you the first 24 hours after you arrive home or your discharge will be delayed. 9. Please wear clothes that are easy to get on and off and wear slip-on shoes.  Thank you for allowing [place] to care for you!   -- [facility]     s

## 2024-03-11 NOTE — Assessment & Plan Note (Signed)
 Most recent labs from January showed LDL of 110 with total cholesterol of 202.  Clearly not at goal on current statin-will convert from pravastatin to rosuvastatin 40 mg daily. -DC pravastatin 40 mg, start rosuvastatin 40 mg daily.

## 2024-03-11 NOTE — Assessment & Plan Note (Signed)
 We will see how the palpitations pain following cath and possible PCI.  If still present would consider repeating monitor.  We resting heart rate of 55 bpm, I am leery of starting on a rate control agent.

## 2024-03-11 NOTE — Assessment & Plan Note (Addendum)
 Coronary CTA shows relatively normal left-sided vessels, but significant disease in the RCA which goes along with potential inferolateral ischemia on Myoview in 2023. FFR ct confirms severe mid RCA stenoses as likely potential etiology for atypical angina palpitation symptoms.  Referred for Interventional Cardiology Consultation to Discuss Cardiac Catheterization -Plan Left Heart Catheterization with Coronary Angiography and Possible Change Coronary Intervention on March 22, 2024,  - Switch pravastatin to rosuvastatin 40 mg for lipid management. - Order pre-cath labs for renal function and CBC. - Discussed risks and benefits of heart catheterization, including potential complications. - Discussed post-procedure antiplatelet therapy with aspirin and Plavix if stents placed. -With only single-vessel disease noted on coronary CT, will load with 300 mg Plavix followed by 75 mg Plavix daily and and continue aspirin 81 mg daily  See Informed Consent below.

## 2024-03-11 NOTE — Telephone Encounter (Signed)
 Patient has request Zyrtec. Medication isnt on list. Message routed to PCP Hazle Nordmann, NP

## 2024-03-12 LAB — CBC
Hematocrit: 38.8 % (ref 34.0–46.6)
Hemoglobin: 12.7 g/dL (ref 11.1–15.9)
MCH: 29.2 pg (ref 26.6–33.0)
MCHC: 32.7 g/dL (ref 31.5–35.7)
MCV: 89 fL (ref 79–97)
Platelets: 236 10*3/uL (ref 150–450)
RBC: 4.35 x10E6/uL (ref 3.77–5.28)
RDW: 13.2 % (ref 11.7–15.4)
WBC: 6.6 10*3/uL (ref 3.4–10.8)

## 2024-03-12 LAB — BASIC METABOLIC PANEL
BUN/Creatinine Ratio: 16 (ref 12–28)
BUN: 14 mg/dL (ref 8–27)
CO2: 24 mmol/L (ref 20–29)
Calcium: 9.8 mg/dL (ref 8.7–10.3)
Chloride: 101 mmol/L (ref 96–106)
Creatinine, Ser: 0.86 mg/dL (ref 0.57–1.00)
Glucose: 76 mg/dL (ref 70–99)
Potassium: 4.7 mmol/L (ref 3.5–5.2)
Sodium: 140 mmol/L (ref 134–144)
eGFR: 74 mL/min/{1.73_m2} (ref >59)

## 2024-03-15 ENCOUNTER — Other Ambulatory Visit: Payer: Self-pay | Admitting: Orthopedic Surgery

## 2024-03-15 MED ORDER — GABAPENTIN 600 MG PO TABS: 600.0000 mg | ORAL_TABLET | Freq: Three times a day (TID) | ORAL | 1 refills | Status: DC

## 2024-03-15 MED ORDER — DOXEPIN HCL 150 MG PO CAPS: 150.0000 mg | ORAL_CAPSULE | Freq: Every day | ORAL | 1 refills | Status: DC

## 2024-03-15 MED ORDER — ALLOPURINOL 300 MG PO TABS: 300.0000 mg | ORAL_TABLET | Freq: Every evening | ORAL | 1 refills | Status: AC

## 2024-03-15 NOTE — Telephone Encounter (Signed)
 Copied from CRM [id number]. Topic: Clinical - Medication Refill >> Mar 15, 2024 11:01 AM Louie Boston wrote: Most Recent Primary Care Visit:  Provider: Hazle Nordmann E  Department: [facility]  Visit Type: OFFICE VISIT  Date: 02/08/2024  Medication: doxepin (SINEQUAN) 150 MG capsule, gabapentin (NEURONTIN) 600 MG tablet, allopurinol (ZYLOPRIM) 300 MG tablet,   Has the patient contacted their pharmacy? Yes (Agent: If no, request that the patient contact the pharmacy for the refill. If patient does not wish to contact the pharmacy document the reason why and proceed with request.) (Agent: If yes, when and what did the pharmacy advise?)  Patient stated the pharmacy has no refills.   Is this the correct pharmacy for this prescription? Yes If no, delete pharmacy and type the correct one.  This is the patient's preferred pharmacy:  [id number] [place], [place] - [address] [place] [address] Phone: [phone] Fax: [phone]   Has the prescription been filled recently? No  Is the patient out of the medication? No  Has the patient been seen for an appointment in the last year OR does the patient have an upcoming appointment? Yes  Can we respond through MyChart? Yes  Agent: Please be advised that Rx refills may take up to 3 business days. We ask that you follow-up with your pharmacy.

## 2024-03-15 NOTE — Telephone Encounter (Signed)
 High risk or very high risk warning populated when attempting to refill medication. RX request sent to PCP for review and approval if warranted.

## 2024-03-20 ENCOUNTER — Telehealth: Payer: Self-pay | Admitting: *Deleted

## 2024-03-20 NOTE — Progress Notes (Signed)
 Cardiology Office Note:  .   Date:  04/02/2024  ID:  Penny Cox, DOB 1956-06-07, MRN [id number] PCP: Arnetha Bhat, NP   [facility]Care Providers Cardiologist:  Randene Bustard, MD   History of Present Illness: .   Penny Cox is a 68 y.o. female with a past medical history of CAD, HLD, HTN. Patient is followed by Dr. Addie Holstein and presents today for a follow up appointment   Per chart review, patient was previously followed by [facility]. She previously had a Coronary CTA in 07/2023 that showed a coronary calcium score of 1195 (99th percentile), severe RCA stenosis, mild LAD stenosis. She continued to have left sided chest pain and exertional dyspnea. She was referred to Dr. Addie Holstein and underwent LHC on 03/22/24 that showed 80% stenosis in proximal RCA1 and 80% stenosis in prox RCA2. Treated with scoring balloon angioplasty and shockwave lithotripsy. There was also 50% mid RCA lesion. All three RCA lesions were covered with DES. Left system was angiographically normal   Today, patient presents after a recent heart catheterization and stent placement. She reports feeling "okay" since the procedure, with no significant chest pain. She does, however, report some discomfort in her chest, which she finds difficult to describe, stating it's "just sore." She is currently experiencing this discomfort. She also reports having scoliosis, which causes chronic pain and makes it difficult for her to differentiate the source of her discomfort. Her chest soreness is near constant, not associated with exertion. Often occurs at rest. Does improve with pain medication. She has not had any improvement or change in symptoms since her cardiac stent was placed.   In addition to her cardiac history, she has a history of scoliosis, which has been causing her significant discomfort, particularly in her back and chest. She reports that her pain is chronic and has been particularly severe since the  week before Christmas, causing her entire back to "lock up." She takes pain medication, which provides some relief and gives her the energy to be active. She also reports some discomfort in her hips.  She also mentions that she has recently quit smoking. She reports no issues with dizziness or feeling like she's going to pass out, and no noticeable fluttering or pounding in her chest.  ROS: Follow up with APP in 2 months, Dr. Addie Holstein in 6 months   Studies Reviewed: .   Cardiac Studies & Procedures   ______________________________________________________________________________________________ CARDIAC CATHETERIZATION  CARDIAC CATHETERIZATION 03/22/2024  Conclusion Images from the original result were not included.    Lesion segment is proximal to mid RCA:   Lesion 1 prox RCA-1 lesion is 80% stenosed.  (Napkin ring calcified lesion); Lesion #2: Prox RCA-2 lesion is 80% stenosed.  (Eccentric lesion)   Scoring balloon angioplasty was performed using a BALLN SCOREFLEX 3.0X10.  Along with shockwave lithotripsy with a 3.0 balloon balloon but unsuccessful lesion prep.   CSI orbital arthrectomy performed on lesions #1 and 2 followed by score flex angioplasty BALLN SCOREFLEX 3.0X10   Lesion #3 mid RCA lesion is 50% stenosed.   A drug-eluting stent was successfully placed covering the lesions #1-2-3, using a SYNERGY XD 3.0X48 -> postdilated in a tapered fashion from 4.2 to 4.0 mm with a 4.0 mm x 20 mm  balloon   Post intervention, there is a 0% residual stenosis with OCT imaging showing full stent expansion and optimization throughout the entire segment.Aaron Aas   ---------------------------------   Angiographically normal Left Coronary System   ---------------------------------  Post intervention, there is a 0% residual stenosis.   LV end diastolic pressure is normal.   There is no aortic valve stenosis.  Diagnostic: Dominance: Right    Intervention  Successful complex, difficult segmental PCI of  proximal to mid RCA lesions requiring combination of score flex angioplasty, shockwave lithotripsy and CSI orbital atherectomy.  Additionally use of guide liner catheter as well as TelePort exchange catheter after CSI. Final placement of a 3.0 mm x 48 mm Synergy XD DES stent postdilated in TAVR fashion from 4.2 to 4.0 mm. Normal LCA Normal LVEDP  RECOMMENDATIONS   Anticipated discharge date to be determined.   Continue to titrate GDMT for CAD Follow-up with Dr. Herbie Baltimore as scheduled.   Recommend uninterrupted dual antiplatelet therapy with Aspirin 81mg  daily and Clopidogrel 75mg  daily for a minimum of 6 months (stable ischemic heart disease-Class I recommendation).   After 6 months, plan will be to stop aspirin and continue long-term Plavix, but okay to hold for procedures or surgeries after 6 months.   Bryan Lemma, MD  Findings Coronary Findings Diagnostic  Dominance: Right  Left Main Vessel was injected. Vessel is large.  Left Anterior Descending Vessel is angiographically normal.  Second Diagonal Branch Vessel is small in size.  Third Diagonal Branch Vessel is small in size.  Left Circumflex Vessel is large. Vessel is angiographically normal.  First Obtuse Marginal Branch Vessel is small in size.  Third Obtuse Marginal Branch Vessel is small in size.  Left Posterior Atrioventricular Artery Vessel is small in size.  Right Coronary Artery Vessel was injected. Vessel is large. There is moderate diffuse disease throughout the vessel. There is severe focal disease in the vessel. Prox RCA-1 lesion is 80% stenosed. The lesion is focal and concentric. The lesion is severely calcified. Initial plan was OCT after 2.5 mm regular balloon.  However the balloon would not fully expand this lesion nor with the OCT catheter crossed.  We therefore moved to the 2.5 mm core flex balloon followed by attempted shockwave lithotripsy.  This did not work despite use of GuideLiner catheter at  the lesion.  Additionally, the 3 mm score flex angioplasty balloon would not cross the sequential lesion.  Therefore we chose to move onto atherectomy followed by Scor flex angioplasty and stenting. Prox RCA-2 lesion is 80% stenosed. The lesion is focal and eccentric. The lesion is severely calcified. Mid RCA lesion is 50% stenosed.  Right Ventricular Branch Vessel is small in size.  Intervention  Prox RCA-1 lesion Angioplasty Lesion length:  12 mm. CATH LAUNCHER 6FR AL.75 guide catheter was inserted. WIRE ASAHI PROWATER 180CM guidewire used to cross lesion. Scoring balloon angioplasty was performed using a BALLN SCOREFLEX 3.0X10. Maximum pressure: 14 atm. Inflation time: 20 sec.  A second ballloon was used, using a cutting  BALLN SCOREFLEX 2.50X10. Maximum pressure:  14 atm. Inflation time:  20 sec.  A third ballloon was used, using a semi-compliant BALLN SAPPHIRE 2.5X15. Maximum pressure:  14 atm.  Inflation time:  20 sec. A standard balloon was used in the side branch. IVL CATH SHOCKWAVE C2 3.0X12 guide catheter was inserted. WIRE ASAHI PROWATER 180CM guidewire was used to cross lesion.  Intravascular Lithotripsy was performed using a CATH SHOCKWAVE C2 3.0X12 with a maximum pressure of 4 atm, for 20 pulses. Atherectomy CATH LAUNCHER 6FR AL.75 guide catheter was inserted. WIRE VIPERWIRE COR FLEX .012 guidewire was used to cross lesion. Orbital atherectomy was performed using a CROWN DIAMONDBACK CLASSIC 1.25. 6 passes taken. Stent (Also  treats lesions: Prox RCA-2, and Mid RCA) Lesion length:  38 mm. CATH LAUNCHER 6FR AL.75 guide catheter was inserted. Lesion crossed with guidewire using a WIRE ASAHI PROWATER 300CM. Pre-stent angioplasty was performed. As noted above A drug-eluting stent was successfully placed using a SYNERGY XD 3.0X48. Maximum pressure: 16 atm. Inflation time: 30 sec. Stent strut is well apposed. Postdilated in tapered fashion from 4.2 to 4.0 mm Post-stent angioplasty was  performed using a BALLN [place] EMERGE MR 4.0X20. Maximum pressure:  18 atm. Inflation time:  20 sec. Post-Intervention Lesion Assessment The intervention was successful. Pre-interventional TIMI flow is 3. Post-intervention TIMI flow is 3. Treated lesion length:  38 mm. No complications occurred at this lesion. There is a 0% residual stenosis post intervention.  Prox RCA-2 lesion Stent (Also treats lesions: Prox RCA-1, and Mid RCA) See details in Prox RCA-1 lesion. Atherectomy CATH LAUNCHER 6FR AL.75 guide catheter was inserted. WIRE VIPERWIRE COR FLEX .012 guidewire was used to cross lesion. Orbital atherectomy was performed using a CROWN DIAMONDBACK CLASSIC 1.25. 6 passes taken. Post-Intervention Lesion Assessment The intervention was successful. Pre-interventional TIMI flow is 3. Post-intervention TIMI flow is 3. Treated lesion length:  38 mm. No complications occurred at this lesion. Optical coherence tomography (OCT) was performed. OCT supply: CATH DRAGONFLY OPSTAR. OCT used following stent ointment and post dilation showing complete stent expansion and optimization. There is a 0% residual stenosis post intervention.  Mid RCA lesion Stent (Also treats lesions: Prox RCA-1, and Prox RCA-2) See details in Prox RCA-1 lesion. Post-Intervention Lesion Assessment The intervention was successful. Pre-interventional TIMI flow is 3. Post-intervention TIMI flow is 3. No complications occurred at this lesion. There is a 0% residual stenosis post intervention.          CT SCANS  CT CORONARY MORPH W/CTA COR W/SCORE 07/21/2023  Addendum 07/28/2023  7:39 AM ADDENDUM REPORT: 07/28/2023 07:37  EXAM: OVER-READ INTERPRETATION  PET-CT CHEST  The following report is an over-read performed by radiologist Dr. Kasandra Pain [facility] on 07/28/2023. This over-read does not include interpretation of cardiac or coronary anatomy or pathology. The cardiac CT interpretation by the cardiologist is  to be attached.  COMPARISON:  None.  FINDINGS: No evidence for lymphadenopathy within the visualized mediastinum or hilar regions.  The visualized lung parenchyma shows no suspicious pulmonary nodule or mass. No focal airspace consolidation. No effusion.  Visualized portions of the upper abdomen are unremarkable.  No suspicious lytic or sclerotic osseous abnormality.  IMPRESSION: No acute or clinically significant extracardiac findings.   Electronically Signed By: Donnal Fusi M.D. On: 07/28/2023 07:37  Narrative CLINICAL DATA:  68 year old with chest pain.  EXAM: Cardiac/Coronary  CTA  TECHNIQUE: The patient was scanned on a [employer].  FINDINGS: A 120 kV prospective scan was triggered in the descending thoracic aorta at 111 HU's. Axial non-contrast 3 mm slices were carried out through the heart. The data set was analyzed on a dedicated work station and scored using the Agatson method. Gantry rotation speed was 250 msecs and collimation was .6 mm. 0.8 mg of sl NTG was given. The 3D data set was reconstructed in 5% intervals of the 67-82 % of the R-R cycle. Diastolic phases were analyzed on a dedicated work station using MPR, MIP and VRT modes. The patient received 80 cc of contrast.  Image quality: good  Aorta:  Normal size.  Aortic atherosclerosis.  No dissection.  Aortic Valve:  Trileaflet.  Mild calcifications.  Coronary Arteries:  Normal coronary origin.  Right dominance.  RCA is a large dominant artery that gives rise to PDA and PLA. There is diffuse proximal to mid calcified plaque, 70-99% stenosis, FFR abnormal 0.99 to 0.72 post lesion.  Left main is a large artery that gives rise to LAD and LCX arteries.  LAD is a large vessel that has proximal calcified plaque, mild stenosis 30-49%. Normal FFR  D1-2-small caliber  LCX is a non-dominant artery. There is scattered calcified plaque, 0-24% stenosis.  OM1- calcified plaque with  50-69% stenosis.  Normal FFR.  Small Ramus branch, no stenosis.  Other findings:  Normal pulmonary vein drainage into the left atrium.  Normal left atrial appendage without a thrombus.  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  Please see radiology report for non cardiac findings.  IMPRESSION: 1. Coronary calcium score of 1195. This was 27 percentile for age and sex matched control.  2. Total plaque volume (TPV) 902 mm3 which is 95th percentile for age-and sex matched controls (calcified plaque 309 mm3; non-calcified plaque 593 mm3). TPV is extensive.  3. Normal coronary origin with right dominance.  4. Severe RCA stenosis 70-99% (FFR 0.72 abnormal), mild LAD 30-49%, moderate OM1 50-69% stenosis. Recommend further evaluation with cardiac catheterization.  5. 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  6. Aortic atherosclerosis  Electronically Signed: By: Dorothye Gathers M.D. On: 07/21/2023 15:48     ______________________________________________________________________________________________      Risk Assessment/Calculations:             Physical Exam:   VS:  BP 126/64 (BP Location: Left Arm, Patient Position: Sitting, Cuff Size: Normal)   Pulse (!) 58   Ht 5\' 9"  (1.753 m)   Wt 187 lb 9.6 oz (85.1 kg)   SpO2 95%   BMI 27.70 kg/m    Wt Readings from Last 3 Encounters:  04/02/24 187 lb 9.6 oz (85.1 kg)  03/22/24 180 lb 3.2 oz (81.7 kg)  03/11/24 186 lb (84.4 kg)    GEN: Well nourished, well developed in no acute distress. Sitting comfortably in the chair  NECK: No JVD; No carotid bruits CARDIAC:  RRR, no murmurs, rubs, gallops. Radial pulses 2+ bilaterally. Right radial cath site soft, minimally tender on palpation. No bruising or swelling on exam  RESPIRATORY:  Clear to auscultation without rales, wheezing or rhonchi. Normal WOB on room air  ABDOMEN: Soft, non-tender, non-distended EXTREMITIES:  No edema in BLE; No deformity   ASSESSMENT AND PLAN: .     CAD  - Patient was referred to Dr. Addie Holstein and seen in 02/2023 for evaluation of abnormal coronary CTA, chest pain, DOE  - Underwent LHC and was found to have 3 RCA lesions- two 80% lesions in the prox RCA that were treated with scoring balloon angioplasty and shockwave lithotripsy. There was also 50% mid RCA, all three lesions were treated with DES  - Patient continues to have some chest discomfort (see below). Denies DOE. Chest pain is not exertional and often occurs at rest  - EKG today is without ischemic changes  - Ordered echocardiogram to assess EF and wall motion  - Continue ASA 81 mg daily, plavix 75 mg daily. Denies bleeding  - Continue imdur 30 mg daily, amlodipine-olmesartan 5-20 mg daily  - Not on BB due to resting bradycardia  - Continue crestor 40 mg daily   Chest Pain  Back Pain  - Patient has scoliosis and has been having chronic back and chest pain since December 2024. Reports near constant  back pain, bilateral shoulder/arm pain, and chest soreness. Chest soreness is not worse with exertion nor relieved with rest. It is almost constant. Did not improve at all when she got her stents placed. Improves if she takes pain medications  - Patient reported having her same chest soreness in the clinic today. Obtained EKG which was without ischemia and was unchanged compared to previous - Her current chest pain is atypical for a cardiac cause. I suspect it is related to her chronic pain/scoliosis. She is followed by ortho  - Ordered echo as above   HTN  - BP well controlled. She denies symptoms of orthostatic hypotension  - Continue amlodipine hydrochlorothiazide 5-20 mg daily  - Creatinine 1.19 and K 4.1 on 4/5   HLD  - Lipid panel from 12/2023 showed LDL 110, total cholesterol 202 - She was transitioned from pravastatin to crestor 40 mg daily in 02/2024  - Needs lipid panel and LFTs in 2-3 months. Can check at follow up appointment   Carotid Artery Disease  - Carotid  ultrasounds from 03/2024 showed 1-39% stenosis bilaterally  - Continue crestor 40 mg daily  - Continue ASA, plavix     Dispo: Follow up in 2 months with APP, 6 months with Dr. Addie Holstein   Signed, Debria Fang, PA-C

## 2024-03-20 NOTE — Telephone Encounter (Signed)
 Cardiac Catheterization scheduled at [facility] for: Friday March 22, 2024 9 AM Arrival time [facility] Main Entrance A at: 7 AM  Nothing to eat after midnight prior to procedure, clear liquids until 5 AM day of procedure.  Medication instructions: -Usual morning medications can be taken with sips of water including aspirin 81 mg and Plavix 75 mg.  Plan to go home the same day, you will only stay overnight if medically necessary.  You must have responsible adult to drive you home.  Someone must be with you the first 24 hours after you arrive home.  Reviewed procedure instructions with patient.

## 2024-03-22 ENCOUNTER — Ambulatory Visit (HOSPITAL_COMMUNITY)
Admission: RE | Disposition: A | Discharge status: Home / Self Care | Payer: Self-pay | Source: Home / Self Care | Attending: Cardiology

## 2024-03-22 ENCOUNTER — Encounter (HOSPITAL_COMMUNITY): Payer: Self-pay | Admitting: Cardiology

## 2024-03-22 ENCOUNTER — Other Ambulatory Visit: Payer: Self-pay

## 2024-03-22 ENCOUNTER — Ambulatory Visit (HOSPITAL_COMMUNITY)
Admission: RE | Admit: 2024-03-22 | Discharge: 2024-03-23 | Disposition: A | Discharge status: Home / Self Care | Attending: Cardiology | Admitting: Cardiology

## 2024-03-22 DIAGNOSIS — E669 Obesity, unspecified: Secondary | ICD-10-CM | POA: Diagnosis not present

## 2024-03-22 DIAGNOSIS — E785 Hyperlipidemia, unspecified: Secondary | ICD-10-CM | POA: Diagnosis not present

## 2024-03-22 DIAGNOSIS — I25118 Atherosclerotic heart disease of native coronary artery with other forms of angina pectoris: Secondary | ICD-10-CM | POA: Insufficient documentation

## 2024-03-22 DIAGNOSIS — Z8249 Family history of ischemic heart disease and other diseases of the circulatory system: Secondary | ICD-10-CM | POA: Insufficient documentation

## 2024-03-22 DIAGNOSIS — Z7982 Long term (current) use of aspirin: Secondary | ICD-10-CM | POA: Insufficient documentation

## 2024-03-22 DIAGNOSIS — R931 Abnormal findings on diagnostic imaging of heart and coronary circulation: Secondary | ICD-10-CM

## 2024-03-22 DIAGNOSIS — I2584 Coronary atherosclerosis due to calcified coronary lesion: Secondary | ICD-10-CM | POA: Insufficient documentation

## 2024-03-22 DIAGNOSIS — Z7902 Long term (current) use of antithrombotics/antiplatelets: Secondary | ICD-10-CM | POA: Insufficient documentation

## 2024-03-22 DIAGNOSIS — R0989 Other specified symptoms and signs involving the circulatory and respiratory systems: Secondary | ICD-10-CM | POA: Insufficient documentation

## 2024-03-22 DIAGNOSIS — E039 Hypothyroidism, unspecified: Secondary | ICD-10-CM | POA: Insufficient documentation

## 2024-03-22 DIAGNOSIS — I129 Hypertensive chronic kidney disease with stage 1 through stage 4 chronic kidney disease, or unspecified chronic kidney disease: Secondary | ICD-10-CM | POA: Diagnosis not present

## 2024-03-22 DIAGNOSIS — Z79899 Other long term (current) drug therapy: Secondary | ICD-10-CM | POA: Diagnosis not present

## 2024-03-22 DIAGNOSIS — I2089 Other forms of angina pectoris: Secondary | ICD-10-CM

## 2024-03-22 DIAGNOSIS — I25119 Atherosclerotic heart disease of native coronary artery with unspecified angina pectoris: Secondary | ICD-10-CM | POA: Diagnosis not present

## 2024-03-22 DIAGNOSIS — I2510 Atherosclerotic heart disease of native coronary artery without angina pectoris: Secondary | ICD-10-CM

## 2024-03-22 DIAGNOSIS — Z6827 Body mass index (BMI) 27.0-27.9, adult: Secondary | ICD-10-CM | POA: Insufficient documentation

## 2024-03-22 DIAGNOSIS — N1831 Chronic kidney disease, stage 3a: Secondary | ICD-10-CM | POA: Insufficient documentation

## 2024-03-22 DIAGNOSIS — F17210 Nicotine dependence, cigarettes, uncomplicated: Secondary | ICD-10-CM | POA: Insufficient documentation

## 2024-03-22 HISTORY — PX: CORONARY IMAGING/OCT: CATH118326

## 2024-03-22 HISTORY — PX: CORONARY LITHOTRIPSY: CATH118330

## 2024-03-22 HISTORY — PX: LEFT HEART CATH AND CORONARY ANGIOGRAPHY: CATH118249

## 2024-03-22 HISTORY — PX: CORONARY STENT INTERVENTION: CATH118234

## 2024-03-22 HISTORY — PX: CORONARY ATHERECTOMY: CATH118238

## 2024-03-22 LAB — POCT ACTIVATED CLOTTING TIME
Activated Clotting Time: 227 s
Activated Clotting Time: 308 s

## 2024-03-22 SURGERY — LEFT HEART CATH AND CORONARY ANGIOGRAPHY
Anesthesia: LOCAL

## 2024-03-22 MED ORDER — SODIUM CHLORIDE 0.9 % IV SOLN: INTRAVENOUS | Status: AC

## 2024-03-22 MED ORDER — CLOPIDOGREL BISULFATE 75 MG PO TABS
75.0000 mg | ORAL_TABLET | Freq: Every day | ORAL | Status: DC
  Administered 2024-03-23: 75 mg via ORAL
  Filled 2024-03-22: qty 1

## 2024-03-22 MED ORDER — VERAPAMIL HCL 2.5 MG/ML IV SOLN
INTRAVENOUS | Status: AC
Start: 2024-03-22 — End: ?
  Filled 2024-03-22: qty 2

## 2024-03-22 MED ORDER — SODIUM CHLORIDE 0.9 % WEIGHT BASED INFUSION: 3.0000 mL/kg/h | INTRAVENOUS | Status: DC

## 2024-03-22 MED ORDER — SODIUM CHLORIDE 0.9 % IV SOLN: 250.0000 mL | INTRAVENOUS | Status: DC | PRN

## 2024-03-22 MED ORDER — MIDAZOLAM HCL 2 MG/2ML IJ SOLN
INTRAMUSCULAR | Status: AC
  Filled 2024-03-22: qty 2

## 2024-03-22 MED ORDER — NITROGLYCERIN 1 MG/10 ML FOR IR/CATH LAB
INTRA_ARTERIAL | Status: AC
  Filled 2024-03-22: qty 10

## 2024-03-22 MED ORDER — GABAPENTIN 300 MG PO CAPS
600.0000 mg | ORAL_CAPSULE | Freq: Three times a day (TID) | ORAL | Status: DC
  Administered 2024-03-22 – 2024-03-23 (×3): 600 mg via ORAL
  Filled 2024-03-22 (×3): qty 2

## 2024-03-22 MED ORDER — LIDOCAINE HCL (PF) 1 % IJ SOLN
INTRAMUSCULAR | Status: DC | PRN
Start: 2024-03-22 — End: 2024-03-22
  Administered 2024-03-22: 4 mL

## 2024-03-22 MED ORDER — HYDRALAZINE HCL 20 MG/ML IJ SOLN: 10.0000 mg | INTRAMUSCULAR | Status: AC | PRN

## 2024-03-22 MED ORDER — MIDAZOLAM HCL 2 MG/2ML IJ SOLN
INTRAMUSCULAR | Status: DC | PRN
  Administered 2024-03-22: 1 mg via INTRAVENOUS
  Administered 2024-03-22: 2 mg via INTRAVENOUS

## 2024-03-22 MED ORDER — ASPIRIN 81 MG PO TBEC
81.0000 mg | DELAYED_RELEASE_TABLET | Freq: Every day | ORAL | Status: DC
  Administered 2024-03-23: 81 mg via ORAL
  Filled 2024-03-22: qty 1

## 2024-03-22 MED ORDER — HEPARIN SODIUM (PORCINE) 1000 UNIT/ML IJ SOLN
INTRAMUSCULAR | Status: AC
  Filled 2024-03-22: qty 10

## 2024-03-22 MED ORDER — FENTANYL CITRATE (PF) 100 MCG/2ML IJ SOLN
INTRAMUSCULAR | Status: AC
  Filled 2024-03-22: qty 2

## 2024-03-22 MED ORDER — PANTOPRAZOLE SODIUM 40 MG PO TBEC
40.0000 mg | DELAYED_RELEASE_TABLET | Freq: Every day | ORAL | Status: DC
  Administered 2024-03-23: 40 mg via ORAL
  Filled 2024-03-22: qty 1

## 2024-03-22 MED ORDER — SODIUM CHLORIDE 0.9% FLUSH: 3.0000 mL | INTRAVENOUS | Status: DC | PRN

## 2024-03-22 MED ORDER — FENTANYL CITRATE (PF) 100 MCG/2ML IJ SOLN
INTRAMUSCULAR | Status: DC | PRN
  Administered 2024-03-22 (×4): 25 ug via INTRAVENOUS

## 2024-03-22 MED ORDER — LEVOTHYROXINE SODIUM 112 MCG PO TABS
112.0000 ug | ORAL_TABLET | Freq: Every day | ORAL | Status: DC
  Administered 2024-03-23: 112 ug via ORAL
  Filled 2024-03-22: qty 1

## 2024-03-22 MED ORDER — ROSUVASTATIN CALCIUM 20 MG PO TABS
40.0000 mg | ORAL_TABLET | Freq: Every day | ORAL | Status: DC
  Administered 2024-03-22 – 2024-03-23 (×2): 40 mg via ORAL
  Filled 2024-03-22 (×2): qty 2

## 2024-03-22 MED ORDER — HEPARIN SODIUM (PORCINE) 1000 UNIT/ML IJ SOLN
INTRAMUSCULAR | Status: DC | PRN
Start: 2024-03-22 — End: 2024-03-22
  Administered 2024-03-22: 3000 [IU] via INTRAVENOUS
  Administered 2024-03-22: 2000 [IU] via INTRAVENOUS
  Administered 2024-03-22: 4000 [IU] via INTRAVENOUS
  Administered 2024-03-22: 2000 [IU] via INTRAVENOUS
  Administered 2024-03-22: 4500 [IU] via INTRAVENOUS

## 2024-03-22 MED ORDER — NITROGLYCERIN 0.4 MG SL SUBL: 0.4000 mg | SUBLINGUAL_TABLET | SUBLINGUAL | Status: DC | PRN

## 2024-03-22 MED ORDER — POLYETHYLENE GLYCOL 3350 17 G PO PACK
17.0000 g | PACK | Freq: Every day | ORAL | Status: DC
  Filled 2024-03-22 (×2): qty 1

## 2024-03-22 MED ORDER — IOHEXOL 350 MG/ML SOLN
INTRAVENOUS | Status: DC | PRN
Start: 2024-03-22 — End: 2024-03-22
  Administered 2024-03-22: 150 mL

## 2024-03-22 MED ORDER — DOXEPIN HCL 25 MG PO CAPS
150.0000 mg | ORAL_CAPSULE | Freq: Every day | ORAL | Status: DC
  Administered 2024-03-22: 150 mg via ORAL
  Filled 2024-03-22: qty 6
  Filled 2024-03-22: qty 2
  Filled 2024-03-22: qty 6

## 2024-03-22 MED ORDER — ACETAMINOPHEN 325 MG PO TABS: 650.0000 mg | ORAL_TABLET | ORAL | Status: DC | PRN

## 2024-03-22 MED ORDER — AMLODIPINE BESYLATE 5 MG PO TABS
5.0000 mg | ORAL_TABLET | Freq: Every day | ORAL | Status: DC
  Administered 2024-03-23: 5 mg via ORAL
  Filled 2024-03-22 (×2): qty 1

## 2024-03-22 MED ORDER — LORATADINE 10 MG PO TABS
10.0000 mg | ORAL_TABLET | Freq: Every day | ORAL | Status: DC
  Administered 2024-03-22 – 2024-03-23 (×2): 10 mg via ORAL
  Filled 2024-03-22 (×2): qty 1

## 2024-03-22 MED ORDER — ACETAMINOPHEN ER 650 MG PO TBCR: 1300.0000 mg | EXTENDED_RELEASE_TABLET | Freq: Three times a day (TID) | ORAL | Status: DC | PRN

## 2024-03-22 MED ORDER — MELATONIN 5 MG PO TABS
15.0000 mg | ORAL_TABLET | Freq: Every day | ORAL | Status: DC
  Administered 2024-03-22: 15 mg via ORAL
  Filled 2024-03-22: qty 3

## 2024-03-22 MED ORDER — SODIUM CHLORIDE 0.9 % WEIGHT BASED INFUSION: 1.0000 mL/kg/h | INTRAVENOUS | Status: DC

## 2024-03-22 MED ORDER — AMLODIPINE-OLMESARTAN 5-20 MG PO TABS: 1.0000 | ORAL_TABLET | Freq: Every day | ORAL | Status: DC

## 2024-03-22 MED ORDER — SODIUM CHLORIDE 0.9% FLUSH
3.0000 mL | Freq: Two times a day (BID) | INTRAVENOUS | Status: DC
  Administered 2024-03-22 – 2024-03-23 (×3): 3 mL via INTRAVENOUS

## 2024-03-22 MED ORDER — ONDANSETRON HCL 4 MG/2ML IJ SOLN: 4.0000 mg | Freq: Four times a day (QID) | INTRAMUSCULAR | Status: DC | PRN

## 2024-03-22 MED ORDER — HEPARIN (PORCINE) IN NACL 1000-0.9 UT/500ML-% IV SOLN
INTRAVENOUS | Status: DC | PRN
  Administered 2024-03-22 (×2): 500 mL

## 2024-03-22 MED ORDER — ALLOPURINOL 300 MG PO TABS
300.0000 mg | ORAL_TABLET | Freq: Every day | ORAL | Status: DC
  Administered 2024-03-22: 300 mg via ORAL
  Filled 2024-03-22: qty 1

## 2024-03-22 MED ORDER — NITROGLYCERIN 0.4 MG/SPRAY TL SOLN: 1.0000 | Status: DC | PRN

## 2024-03-22 MED ORDER — HEPARIN (PORCINE) IN NACL 1000-0.9 UT/500ML-% IV SOLN
INTRAVENOUS | Status: DC | PRN
Start: 2024-03-22 — End: 2024-03-23
  Administered 2024-03-22: 500 mL

## 2024-03-22 MED ORDER — VERAPAMIL HCL 2.5 MG/ML IV SOLN
INTRAVENOUS | Status: DC | PRN
  Administered 2024-03-22: 10 mL via INTRA_ARTERIAL

## 2024-03-22 MED ORDER — SODIUM CHLORIDE 0.9 % IV SOLN
INTRAVENOUS | Status: AC | PRN
  Administered 2024-03-22: 10 mL/h via INTRAVENOUS

## 2024-03-22 MED ORDER — BENAZEPRIL HCL 20 MG PO TABS
20.0000 mg | ORAL_TABLET | Freq: Every day | ORAL | Status: DC
  Administered 2024-03-23: 20 mg via ORAL
  Filled 2024-03-22: qty 1

## 2024-03-22 MED ORDER — VIPERSLIDE LUBRICANT OPTIME: TOPICAL | Status: DC | PRN

## 2024-03-22 MED ORDER — LIDOCAINE HCL (PF) 1 % IJ SOLN
INTRAMUSCULAR | Status: AC
Start: 2024-03-22 — End: ?
  Filled 2024-03-22: qty 30

## 2024-03-22 SURGICAL SUPPLY — 32 items
BALLN SAPPHIRE 2.5X15 (BALLOONS) ×1 IMPLANT
BALLN SCOREFLEX 2.50X10 (BALLOONS) ×1 IMPLANT
BALLN SCOREFLEX 3.0X10 (BALLOONS) ×1 IMPLANT
BALLN ~~LOC~~ EMERGE MR 4.0X20 (BALLOONS) ×1 IMPLANT
BALLOON SAPPHIRE 2.5X15 (BALLOONS) IMPLANT
BALLOON SCOREFLEX 2.50X10 (BALLOONS) IMPLANT
BALLOON SCOREFLEX 3.0X10 (BALLOONS) IMPLANT
BALLOON ~~LOC~~ EMERGE MR 4.0X20 (BALLOONS) IMPLANT
CATH DRAGONFLY OPSTAR (CATHETERS) IMPLANT
CATH GUIDELINER COAST (CATHETERS) IMPLANT
CATH INFINITI AMBI 5FR TG (CATHETERS) IMPLANT
CATH LAUNCHER 6FR AL.75 (CATHETERS) IMPLANT
CATH LAUNCHER 6FR JR4 (CATHETERS) IMPLANT
CATH SHOCKWAVE C2 3.0X12 (CATHETERS) IMPLANT
CATH TELEPORT (CATHETERS) IMPLANT
CROWN DIAMONDBACK CLASSIC 1.25 (BURR) IMPLANT
DEVICE RAD COMP TR BAND LRG (VASCULAR PRODUCTS) IMPLANT
ELECT DEFIB PAD ADLT CADENCE (PAD) IMPLANT
GLIDESHEATH SLEND SS 6F .021 (SHEATH) IMPLANT
GUIDEWIRE INQWIRE 1.5J.035X260 (WIRE) IMPLANT
INQWIRE 1.5J .035X260CM (WIRE) ×1 IMPLANT
KIT ENCORE 26 ADVANTAGE (KITS) IMPLANT
LUBRICANT VIPERSLIDE CORONARY (MISCELLANEOUS) IMPLANT
PACK CARDIAC CATHETERIZATION (CUSTOM PROCEDURE TRAY) ×1 IMPLANT
SET ATX-X65L (MISCELLANEOUS) IMPLANT
SHEATH PROBE COVER 6X72 (BAG) IMPLANT
STENT SYNERGY XD 3.0X48 (Permanent Stent) IMPLANT
SYNERGY XD 3.0X48 (Permanent Stent) ×1 IMPLANT
TUBING CIL FLEX 10 FLL-RA (TUBING) IMPLANT
WIRE ASAHI PROWATER 180CM (WIRE) IMPLANT
WIRE ASAHI PROWATER 300CM (WIRE) IMPLANT
WIRE VIPERWIRE COR FLEX .012 (WIRE) IMPLANT

## 2024-03-22 NOTE — Discharge Instructions (Signed)

## 2024-03-22 NOTE — Interval H&P Note (Signed)
 History and Physical Interval Note:  03/22/2024 8:58 AM  Penny Cox  has presented today for surgery, with the diagnosis of abnormal cardiac cta; angina.  The various methods of treatment have been discussed with the patient and family. After consideration of risks, benefits and other options for treatment, the patient has consented to  Procedure(s): LEFT HEART CATH AND CORONARY ANGIOGRAPHY (N/A)  PERCUTANEOUS CORONARY INTERVENTION  as a surgical intervention.  The patient's history has been reviewed, patient examined, no change in status, stable for surgery.  I have reviewed the patient's chart and labs.  Questions were answered to the patient's satisfaction.    Cath Lab Visit (complete for each Cath Lab visit)  Clinical Evaluation Leading to the Procedure:   ACS: No.  Non-ACS:    Anginal Classification: CCS III  Anti-ischemic medical therapy: Minimal Therapy (1 class of medications)  Non-Invasive Test Results: High-risk stress test findings: cardiac mortality >3%/year  Prior CABG: No previous CABG    Bryan Lemma

## 2024-03-22 NOTE — TOC CM/SW Note (Signed)
 Transition of Care [facility]) - Inpatient Brief Assessment   Patient Details  Name: Penny Cox MRN: [id number] Date of Birth: Mar 04, 1956  Transition of [facility]) CM/SW Contact:    Gala Lewandowsky, RN Phone Number: 03/22/2024, 3:00 PM   Clinical Narrative: Patient presented for LHC. PTA patient was from home. Patient states she has PCP and has no issues getting to appointments. Patient has insurance and gets medications appropriately. No home needs identified at this time.    Transition of Care Asessment: Insurance and Status: Insurance coverage has been reviewed Patient has primary care physician: Yes Home environment has been reviewed: reviewed Prior level of function:: independent Prior/Current Home Services: No current home services Social Drivers of Health Review: SDOH reviewed no interventions necessary Readmission risk has been reviewed: Yes Transition of care needs: no transition of care needs at this time

## 2024-03-23 ENCOUNTER — Encounter (HOSPITAL_COMMUNITY): Payer: Self-pay | Admitting: Cardiology

## 2024-03-23 ENCOUNTER — Telehealth (HOSPITAL_COMMUNITY): Payer: Self-pay

## 2024-03-23 DIAGNOSIS — E039 Hypothyroidism, unspecified: Secondary | ICD-10-CM | POA: Diagnosis not present

## 2024-03-23 DIAGNOSIS — N1831 Chronic kidney disease, stage 3a: Secondary | ICD-10-CM | POA: Diagnosis not present

## 2024-03-23 DIAGNOSIS — I2584 Coronary atherosclerosis due to calcified coronary lesion: Secondary | ICD-10-CM | POA: Diagnosis not present

## 2024-03-23 DIAGNOSIS — Z7902 Long term (current) use of antithrombotics/antiplatelets: Secondary | ICD-10-CM | POA: Diagnosis not present

## 2024-03-23 DIAGNOSIS — Z79899 Other long term (current) drug therapy: Secondary | ICD-10-CM | POA: Diagnosis not present

## 2024-03-23 DIAGNOSIS — I25118 Atherosclerotic heart disease of native coronary artery with other forms of angina pectoris: Secondary | ICD-10-CM | POA: Diagnosis not present

## 2024-03-23 DIAGNOSIS — R0989 Other specified symptoms and signs involving the circulatory and respiratory systems: Secondary | ICD-10-CM | POA: Diagnosis not present

## 2024-03-23 DIAGNOSIS — I10 Essential (primary) hypertension: Secondary | ICD-10-CM | POA: Diagnosis not present

## 2024-03-23 DIAGNOSIS — F17210 Nicotine dependence, cigarettes, uncomplicated: Secondary | ICD-10-CM | POA: Diagnosis not present

## 2024-03-23 DIAGNOSIS — I2510 Atherosclerotic heart disease of native coronary artery without angina pectoris: Secondary | ICD-10-CM

## 2024-03-23 DIAGNOSIS — I129 Hypertensive chronic kidney disease with stage 1 through stage 4 chronic kidney disease, or unspecified chronic kidney disease: Secondary | ICD-10-CM | POA: Diagnosis not present

## 2024-03-23 DIAGNOSIS — Z7982 Long term (current) use of aspirin: Secondary | ICD-10-CM | POA: Diagnosis not present

## 2024-03-23 DIAGNOSIS — Z8249 Family history of ischemic heart disease and other diseases of the circulatory system: Secondary | ICD-10-CM | POA: Diagnosis not present

## 2024-03-23 DIAGNOSIS — E785 Hyperlipidemia, unspecified: Secondary | ICD-10-CM | POA: Diagnosis not present

## 2024-03-23 LAB — CBC
HCT: 35.3 % — ABNORMAL LOW (ref 36.0–46.0)
Hemoglobin: 11.7 g/dL — ABNORMAL LOW (ref 12.0–15.0)
MCH: 29 pg (ref 26.0–34.0)
MCHC: 33.1 g/dL (ref 30.0–36.0)
MCV: 87.4 fL (ref 80.0–100.0)
Platelets: 174 10*3/uL (ref 150–400)
RBC: 4.04 MIL/uL (ref 3.87–5.11)
RDW: 13.6 % (ref 11.5–15.5)
WBC: 5.6 10*3/uL (ref 4.0–10.5)
nRBC: 0 % (ref 0.0–0.2)

## 2024-03-23 LAB — BASIC METABOLIC PANEL WITH GFR
Anion gap: 7 (ref 5–15)
BUN: 15 mg/dL (ref 8–23)
CO2: 28 mmol/L (ref 22–32)
Calcium: 9.3 mg/dL (ref 8.9–10.3)
Chloride: 106 mmol/L (ref 98–111)
Creatinine, Ser: 1.19 mg/dL — ABNORMAL HIGH (ref 0.44–1.00)
GFR, Estimated: 50 mL/min — ABNORMAL LOW (ref >60)
Glucose, Bld: 90 mg/dL (ref 70–99)
Potassium: 4.1 mmol/L (ref 3.5–5.1)
Sodium: 141 mmol/L (ref 135–145)

## 2024-03-23 LAB — POCT ACTIVATED CLOTTING TIME
Activated Clotting Time: 349 s
Activated Clotting Time: 256 s
Activated Clotting Time: 377 s
Activated Clotting Time: 245 s

## 2024-03-23 NOTE — Progress Notes (Signed)
   03/22/24 2311  Assess: MEWS Score  Temp 98 F (36.7 C)  BP (!) 120/50  MAP (mmHg) 72  Pulse Rate (!) 48  ECG Heart Rate (!) 49  Resp 13  Level of Consciousness Alert  SpO2 97 %  O2 Device Room Air  Assess: MEWS Score  MEWS Temp 0  MEWS Systolic 0  MEWS Pulse 1  MEWS RR 1  MEWS LOC 0  MEWS Score 2  MEWS Score Color Yellow  Assess: if the MEWS score is Yellow or Red  Were vital signs accurate and taken at a resting state? Yes  Does the patient meet 2 or more of the SIRS criteria? Yes  Does the patient have a confirmed or suspected source of infection? No  MEWS guidelines implemented  Yes, yellow  Treat  MEWS Interventions Considered administering scheduled or prn medications/treatments as ordered  Take Vital Signs  Increase Vital Sign Frequency  Yellow: Q2hr x1, continue Q4hrs until patient remains green for 12hrs  Escalate  MEWS: Escalate Yellow: Discuss with charge nurse and consider notifying provider and/or RRT  Notify: Charge Nurse/RN  Name of Charge Nurse/RN Notified Dana, RN  Assess: SIRS CRITERIA  SIRS Temperature  0  SIRS Respirations  0  SIRS Pulse 0  SIRS WBC 0  SIRS Score Sum  0

## 2024-03-23 NOTE — Telephone Encounter (Signed)
 Attempt to call pt regarding phase 1 cardiac rehab education. No answer, VM full.   Jonna Coup, MS, ACSM-CEP 03/23/2024 12:53 PM

## 2024-03-23 NOTE — Plan of Care (Signed)
  Problem: Activity: Goal: Ability to return to baseline activity level will improve Outcome: Progressing   Problem: Cardiovascular: Goal: Ability to achieve and maintain adequate cardiovascular perfusion will improve Outcome: Progressing   Problem: Health Behavior/Discharge Planning: Goal: Ability to manage health-related needs will improve Outcome: Progressing   Problem: Clinical Measurements: Goal: Will remain free from infection Outcome: Progressing Goal: Respiratory complications will improve Outcome: Progressing Goal: Cardiovascular complication will be avoided Outcome: Progressing   Problem: Activity: Goal: Risk for activity intolerance will decrease Outcome: Progressing   Problem: Safety: Goal: Ability to remain free from injury will improve Outcome: Progressing

## 2024-03-23 NOTE — Progress Notes (Signed)
   Rounding Note    Patient Name: Penny Cox Date of Encounter: 03/23/2024  [facility] Cardiologist: Bryan Lemma, MD   Subjective   No acute events overnight.  Chest pain-free this morning.  Eager to go home.  Vital Signs    Vitals:   03/22/24 2248 03/22/24 2311 03/23/24 0311 03/23/24 0734  BP:  (!) 120/50 125/66 135/64  Pulse:  (!) 48 (!) 45 (!) 49  Resp: 14 13 16    Temp:  98 F (36.7 C) 98.4 F (36.9 C) 97.8 F (36.6 C)  TempSrc:  Oral Oral Oral  SpO2: 97% 97% 97%   Weight:      Height:        Intake/Output Summary (Last 24 hours) at 03/23/2024 1015 Last data filed at 03/23/2024 0735 Gross per 24 hour  Intake 360 ml  Output --  Net 360 ml      03/22/2024   12:01 PM 03/22/2024    7:58 AM 03/11/2024    9:47 AM  Last 3 Weights  Weight (lbs) 180 lb 3.2 oz 180 lb 186 lb  Weight (kg) 81.738 kg 81.647 kg 84.369 kg      Telemetry    Personally Reviewed  ECG    Personally Reviewed  Physical Exam   GEN: No acute distress.   Cardiac: RRR, no murmurs, rubs, or gallops.  Respiratory: Clear to auscultation bilaterally. Psych: Normal affect   Assessment & Plan    #Angina #Coronary artery disease Chest pain-free.  Hemodynamically and electrically stable this morning. Continue aspirin 81 mg by mouth once daily Continue Plavix 75 mg by mouth once daily Continue rosuvastatin 40 mg by mouth once daily Will need outpatient echocardiogram.  LVEDP normal during heart cath.  No evidence of heart failure on exam.  #Hypertension Continue amlodipine and benazepril.  Okay to discharge.  Close outpatient follow-up.  Sheria Lang T. Lalla Brothers, MD, [facility], [facility] Cardiac Electrophysiology

## 2024-03-23 NOTE — Discharge Summary (Signed)
 Discharge Summary    Patient ID: Penny Cox MRN: [id number]; DOB: 23-Jul-1956  Admit date: 03/22/2024 Discharge date: 03/23/2024  PCP:  Octavia Heir, NP   [facility] Providers Cardiologist:  Bryan Lemma, MD     Discharge Diagnoses    Principal Problem:   CAD S/P percutaneous coronary angioplasty    Diagnostic Studies/Procedures    LHC 03/22/24   Lesion segment is proximal to mid RCA:   Lesion 1 prox RCA-1 lesion is 80% stenosed.  (Napkin ring calcified lesion); Lesion #2: Prox RCA-2 lesion is 80% stenosed.  (Eccentric lesion)   Scoring balloon angioplasty was performed using a BALLN SCOREFLEX 3.0X10.  Along with shockwave lithotripsy with a 3.0 balloon balloon but unsuccessful lesion prep.   CSI orbital arthrectomy performed on lesions #1 and 2 followed by score flex angioplasty BALLN SCOREFLEX 3.0X10   Lesion #3 mid RCA lesion is 50% stenosed.   A drug-eluting stent was successfully placed covering the lesions #1-2-3, using a SYNERGY XD 3.0X48 -> postdilated in a tapered fashion from 4.2 to 4.0 mm with a 4.0 mm x 20 mm [place] balloon   Post intervention, there is a 0% residual stenosis with OCT imaging showing full stent expansion and optimization throughout the entire segment.Marland Kitchen   ---------------------------------   Angiographically normal Left Coronary System   ---------------------------------   Post intervention, there is a 0% residual stenosis.   LV end diastolic pressure is normal.   There is no aortic valve stenosis.   Diagnostic: Dominance: Right                                        Intervention  _____________   History of Present Illness     Penny Cox is a 68 y.o. female with a past medical history of CAD, HLD, HTN. Patient is followed by Dr. Herbie Baltimore    Per chart review, patient was previously followed by [facility]. She previously had a Coronary CTA in 07/2023 that showed a coronary calcium score of 1195 (99th percentile),  severe RCA stenosis, mild LAD stenosis. She continued to have left sided chest pain and exertional dyspnea. She was referred to Dr. Herbie Baltimore and was seen in clinic in 02/2024. A LHC was arranged for further evaluation     Hospital Course     Consultants: None    CAD  - Patient was referred to Dr. Herbie Baltimore and seen in 02/2024 for evaluation of abnormal coronary CTA, chest pain, DOE  - Underwent LHC on 03/22/24 that showed 3 lesions in the RCA. RCA-1 lesion 80% stenosed. RCA-2 lesion 80% stenosed. RCA-3 lesion 50% stenosed. Underwent complex PCI of the proximal to mid RCA lesions requiring combination of balloon angioplasty, shockwave lithotripsy, and CSI orbital atherectomy. 1 DES was placed covering all 3 lesions Left coronary system was angiographically normal  - Due to the complexity of the intervention, patient was admitted overnight for observation  - Patient was seen by Dr. Lalla Brothers on 4/5 and was deemed stable for discharge. She was chest pain free, hemodynamically and electrically stable  - Discussed right radial site care and provided written instructions on AVS. Patient was seen by cardiac rehab prior to DC and has been referred to outpatient cardiac rehab  - Continue DAPT with ASA 81 mg daily, plavix 75 mg daily for a minimum of 6 months. After 6 months, plan to stop ASA and  continue plavix long term  - Continue crestor 40 mg daily  - Will need outpatient echo. She has an appointment with APP on 4/15, plan to order at that time  - Continue imdur 30 mg daily    HTN  - Continue amlodipine olmesartan 5-20 mg daily  - Continue imdur 30 mg daily    HLD  - Lipid panel from 12/2023 showed LDL 110, total cholesterol 202 - She was transitioned from pravastatin to crestor 40 mg daily in 02/2024  - Will need repeat LFTs and lipid panel in approx 2 months       Did the patient have an acute coronary syndrome (MI, NSTEMI, STEMI, etc) this admission?:  No                               Did the  patient have a percutaneous coronary intervention (stent / angioplasty)?:  Yes.     Cath/PCI Registry Performance & Quality Measures: Aspirin prescribed? - Yes ADP Receptor Inhibitor (Plavix/Clopidogrel, Brilinta/Ticagrelor or Effient/Prasugrel) prescribed (includes medically managed patients)? - Yes High Intensity Statin (Lipitor 40-80mg  or Crestor 20-40mg ) prescribed? - Yes For EF <40%, was ACEI/ARB prescribed? - Not Applicable (EF >/= 40%) For EF <40%, Aldosterone Antagonist (Spironolactone or Eplerenone) prescribed? - Not Applicable (EF >/= 40%) Cardiac Rehab Phase II ordered? - Yes      _____________  Discharge Vitals Blood pressure 135/64, pulse (!) 49, temperature 97.8 F (36.6 C), temperature source Oral, resp. rate 16, height 5\' 9"  (1.753 m), weight 81.7 kg, SpO2 97%.  Filed Weights   03/22/24 0758 03/22/24 1201  Weight: 81.6 kg 81.7 kg    Labs & Radiologic Studies    CBC Recent Labs    03/23/24 0454  WBC 5.6  HGB 11.7*  HCT 35.3*  MCV 87.4  PLT 174   Basic Metabolic Panel Recent Labs    16/10/96 0454  NA 141  K 4.1  CL 106  CO2 28  GLUCOSE 90  BUN 15  CREATININE 1.19*  CALCIUM 9.3   Liver Function Tests No results for input(s): "AST", "ALT", "ALKPHOS", "BILITOT", "PROT", "ALBUMIN" in the last 72 hours. No results for input(s): "LIPASE", "AMYLASE" in the last 72 hours. High Sensitivity Troponin:   No results for input(s): "TROPONINIHS" in the last 720 hours.  BNP Invalid input(s): "POCBNP" D-Dimer No results for input(s): "DDIMER" in the last 72 hours. Hemoglobin A1C No results for input(s): "HGBA1C" in the last 72 hours. Fasting Lipid Panel No results for input(s): "CHOL", "HDL", "LDLCALC", "TRIG", "CHOLHDL", "LDLDIRECT" in the last 72 hours. Thyroid Function Tests No results for input(s): "TSH", "T4TOTAL", "T3FREE", "THYROIDAB" in the last 72 hours.  Invalid input(s): "FREET3" _____________  CARDIAC CATHETERIZATION Result Date:  03/22/2024 Images from the original result were not included.   Lesion segment is proximal to mid RCA:   Lesion 1 prox RCA-1 lesion is 80% stenosed.  (Napkin ring calcified lesion); Lesion #2: Prox RCA-2 lesion is 80% stenosed.  (Eccentric lesion)   Scoring balloon angioplasty was performed using a BALLN SCOREFLEX 3.0X10.  Along with shockwave lithotripsy with a 3.0 balloon balloon but unsuccessful lesion prep.   CSI orbital arthrectomy performed on lesions #1 and 2 followed by score flex angioplasty BALLN SCOREFLEX 3.0X10   Lesion #3 mid RCA lesion is 50% stenosed.   A drug-eluting stent was successfully placed covering the lesions #1-2-3, using a SYNERGY XD 3.0X48 -> postdilated in a tapered fashion from  4.2 to 4.0 mm with a 4.0 mm x 20 mm [place] balloon   Post intervention, there is a 0% residual stenosis with OCT imaging showing full stent expansion and optimization throughout the entire segment.Marland Kitchen   ---------------------------------   Angiographically normal Left Coronary System   ---------------------------------   Post intervention, there is a 0% residual stenosis.   LV end diastolic pressure is normal.   There is no aortic valve stenosis. Diagnostic: Dominance: Right    Intervention Successful complex, difficult segmental PCI of proximal to mid RCA lesions requiring combination of score flex angioplasty, shockwave lithotripsy and CSI orbital atherectomy.  Additionally use of guide liner catheter as well as TelePort exchange catheter after CSI. Final placement of a 3.0 mm x 48 mm Synergy XD DES stent postdilated in TAVR fashion from 4.2 to 4.0 mm. Normal LCA Normal LVEDP RECOMMENDATIONS   Anticipated discharge date to be determined.   Continue to titrate GDMT for CAD Follow-up with Dr. Herbie Baltimore as scheduled.   Recommend uninterrupted dual antiplatelet therapy with Aspirin 81mg  daily and Clopidogrel 75mg  daily for a minimum of 6 months (stable ischemic heart disease-Class I recommendation).   After 6 months, plan  will be to stop aspirin and continue long-term Plavix, but okay to hold for procedures or surgeries after 6 months. Bryan Lemma, MD  Disposition   Pt is being discharged home today in good condition.  Follow-up Plans & Appointments   Patient has follow up with gen cards APP on 04/02/24   Discharge Instructions     AMB Referral to Cardiac Rehabilitation - Phase II   Complete by: As directed    Diagnosis:  Coronary Stents Stable Angina     After initial evaluation and assessments completed: Virtual Based Care may be provided alone or in conjunction with Phase 2 Cardiac Rehab based on patient barriers.: Yes   Intensive Cardiac Rehabilitation (ICR) [facility] location only OR Traditional Cardiac Rehabilitation (TCR) *If criteria for ICR are not met will enroll in TCR [facility] only): Yes   Diet - low sodium heart healthy   Complete by: As directed    Discharge instructions   Complete by: As directed    Radial Site Care Refer to this sheet in the next few weeks. These instructions provide you with information on caring for yourself after your procedure. Your caregiver may also give you more specific instructions. Your treatment has been planned according to current medical practices, but problems sometimes occur. Call your caregiver if you have any problems or questions after your procedure. HOME CARE INSTRUCTIONS You may shower the day after the procedure. Remove the bandage (dressing) and gently wash the site with plain soap and water. Gently pat the site dry.  Do not apply powder or lotion to the site.  Do not submerge the affected site in water for 3 to 5 days.  Inspect the site at least twice daily.  Do not flex or bend the affected arm for 24 hours.  No lifting over 5 pounds (2.3 kg) for 5 days after your procedure.  Do not drive home if you are discharged the same day of the procedure. Have someone else drive you.  You may drive 24 hours after the procedure unless otherwise instructed by your  caregiver.  What to expect: Any bruising will usually fade within 1 to 2 weeks.  Blood that collects in the tissue (hematoma) may be painful to the touch. It should usually decrease in size and tenderness within 1 to 2 weeks.  SEEK IMMEDIATE MEDICAL CARE IF: You have unusual pain at the radial site.  You have redness, warmth, swelling, or pain at the radial site.  You have drainage (other than a small amount of blood on the dressing).  You have chills.  You have a fever or persistent symptoms for more than 72 hours.  You have a fever and your symptoms suddenly get worse.  Your arm becomes pale, cool, tingly, or numb.  You have heavy bleeding from the site. Hold pressure on the site.   Increase activity slowly   Complete by: As directed         Discharge Medications   Allergies as of 03/23/2024       Reactions   Labetalol    Seizures    Advil [ibuprofen]    Kidney pain    Lyrica [pregabalin]    Makes her very groggy        Medication List     TAKE these medications    acetaminophen 650 MG CR tablet Commonly known as: TYLENOL Take 1,300 mg by mouth every 8 (eight) hours as needed for pain.   allopurinol 300 MG tablet Commonly known as: ZYLOPRIM Take 1 tablet (300 mg total) by mouth at bedtime.   amLODipine-olmesartan 5-20 MG tablet Commonly known as: Azor Take 1 tablet by mouth daily.   aspirin EC 81 MG tablet Take 1 tablet (81 mg total) by mouth daily. Swallow whole.   B-12 1000 MCG Tabs Take 1,000 mcg by mouth daily.   baclofen 20 MG tablet Commonly known as: LIORESAL Take 20 mg by mouth 2 (two) times daily as needed for muscle spasms.   Belbuca 600 MCG Film Generic drug: Buprenorphine HCl Place 600 mcg inside cheek in the morning and at bedtime. Twice a day   Biotin 5 MG Tabs Take 2 tablets by mouth at bedtime.   CALCIUM-VITAMIN D PO Take 1 tablet by mouth daily.   cetirizine 10 MG tablet Commonly known as: ZYRTEC Take 1 tablet (10 mg total) by  mouth at bedtime.   clopidogrel 75 MG tablet Commonly known as: PLAVIX Take 1 tablet (75 mg total) by mouth daily. Take 3 tablets the first day of starting medication   doxepin 150 MG capsule Commonly known as: SINEQUAN Take 1 capsule (150 mg total) by mouth at bedtime.   Emgality 120 MG/ML [place] Generic drug: Galcanezumab-gnlm 1 (ONE) SOLUTION AUTO-INJECTO SOLUTION AUTO-INJECTOR MONTHLY   gabapentin 600 MG tablet Commonly known as: NEURONTIN Take 1 tablet (600 mg total) by mouth 3 (three) times daily.   Glucosamine-Chondroitin-MSM 500-200-150 MG Tabs Take 1 tablet by mouth 2 (two) times daily.   isosorbide mononitrate 30 MG 24 hr tablet Commonly known as: IMDUR Take 30 mg by mouth daily.   levothyroxine 112 MCG tablet Commonly known as: SYNTHROID TAKE 1 TABLET BY MOUTH EVERY DAY BEFORE BREAKFAST   MELATONIN PO Take 15 mg by mouth at bedtime.   MULTIVITAMIN ADULT PO Take 1 tablet by mouth daily.   nitroGLYCERIN 0.4 MG/SPRAY spray Commonly known as: NITROLINGUAL Place 1 spray under the tongue every 5 (five) minutes x 3 doses as needed for chest pain.   pantoprazole 40 MG tablet Commonly known as: PROTONIX Take 1 tablet (40 mg total) by mouth daily.   polyethylene glycol 17 g packet Commonly known as: MIRALAX / GLYCOLAX Take 17 g by mouth daily.   QC TUMERIC COMPLEX PO Take 1 capsule by mouth daily.   rosuvastatin 40 MG tablet Commonly known as:  CRESTOR Take 1 tablet (40 mg total) by mouth daily.   traZODone 100 MG tablet Commonly known as: DESYREL Take 100 mg by mouth at bedtime.   varenicline 1 MG tablet Commonly known as: Chantix Continuing Month Pak Take 1 tablet (1 mg total) by mouth 2 (two) times daily.   Vitamin C 500 MG Caps Take 1 capsule by mouth daily.   vitamin E 180 MG (400 UNITS) capsule Take 400 Units by mouth 2 (two) times daily.           Outstanding Labs/Studies    Duration of Discharge Encounter: APP Time: 20 minutes    Signed, Jonita Albee, PA-C 03/23/2024, 10:45 AM

## 2024-03-25 LAB — LIPOPROTEIN A (LPA): Lipoprotein (a): 246.2 nmol/L — ABNORMAL HIGH (ref <75.0)

## 2024-03-25 MED FILL — Nitroglycerin IV Soln 100 MCG/ML in D5W: INTRA_ARTERIAL | Qty: 10 | Status: AC

## 2024-03-25 NOTE — Progress Notes (Signed)
 CARDIAC REHAB PHASE I   Unable to reach via telephone. CRP2 referral sent to [facility]. Will send educational information to address on file.     Woodroe Chen, RN BSN 03/25/2024 2:14 PM

## 2024-03-26 ENCOUNTER — Ambulatory Visit (HOSPITAL_COMMUNITY)
Admission: RE | Admit: 2024-03-26 | Discharge: 2024-03-26 | Disposition: A | Discharge status: Home / Self Care | Source: Ambulatory Visit | Attending: Cardiology | Admitting: Cardiology

## 2024-03-26 DIAGNOSIS — R0989 Other specified symptoms and signs involving the circulatory and respiratory systems: Secondary | ICD-10-CM | POA: Insufficient documentation

## 2024-03-29 ENCOUNTER — Encounter: Payer: Self-pay | Admitting: Physician Assistant

## 2024-03-29 ENCOUNTER — Telehealth (HOSPITAL_COMMUNITY): Payer: Self-pay

## 2024-03-29 NOTE — Telephone Encounter (Signed)
 Attempted to call patient in regards to Cardiac Rehab - LM on VM

## 2024-03-29 NOTE — Telephone Encounter (Signed)
 Pt returned CR phone call and stated she is not interested at this time due to back and hip issues.  Closed referral

## 2024-03-29 NOTE — Telephone Encounter (Signed)
 Pt insurance is active and benefits verified through [facility] Medicare. Co-pay $0.00, DED $0.00/$0.00 met, out of pocket $3,900.00/$172.06 met, co-insurance 0%. No pre-authorization required. Passport, 03/29/24 @ 4:17PM, REF#[id number]   How many CR sessions are covered? (36 visits for TCR, 72 visits for ICR)72 Is this a lifetime maximum or an annual maximum? Annual Has the member used any of these services to date? No Is there a time limit (weeks/months) on start of program and/or program completion? No     Will contact patient to see if she is interested in the Cardiac Rehab Program. If interested, patient will need to complete follow up appt. Once completed, patient will be contacted for scheduling upon review by the RN Navigator.

## 2024-04-02 ENCOUNTER — Ambulatory Visit: Attending: Cardiology | Admitting: Cardiology

## 2024-04-02 ENCOUNTER — Encounter: Payer: Self-pay | Admitting: Cardiology

## 2024-04-02 VITALS — BP 126/64 | HR 58 | Ht 69.0 in | Wt 187.6 lb

## 2024-04-02 DIAGNOSIS — I10 Essential (primary) hypertension: Secondary | ICD-10-CM | POA: Diagnosis not present

## 2024-04-02 DIAGNOSIS — I6523 Occlusion and stenosis of bilateral carotid arteries: Secondary | ICD-10-CM | POA: Diagnosis not present

## 2024-04-02 DIAGNOSIS — I2510 Atherosclerotic heart disease of native coronary artery without angina pectoris: Secondary | ICD-10-CM | POA: Diagnosis not present

## 2024-04-02 DIAGNOSIS — Z9861 Coronary angioplasty status: Secondary | ICD-10-CM | POA: Diagnosis not present

## 2024-04-02 DIAGNOSIS — E785 Hyperlipidemia, unspecified: Secondary | ICD-10-CM

## 2024-04-02 DIAGNOSIS — R079 Chest pain, unspecified: Secondary | ICD-10-CM

## 2024-04-02 NOTE — Patient Instructions (Addendum)
 Medication Instructions:  No changes *If you need a refill on your cardiac medications before your next appointment, please call your pharmacy*  Lab Work: No labs  Testing/Procedures: Your physician has requested that you have an echocardiogram. Echocardiography is a painless test that uses sound waves to create images of your heart. It provides your doctor with information about the size and shape of your heart and how well your heart's chambers and valves are working. This procedure takes approximately one hour. There are no restrictions for this procedure. Please do NOT wear cologne, perfume, aftershave, or lotions (deodorant is allowed). Please arrive 15 minutes prior to your appointment time.  Please note: We ask at that you not bring children with you during ultrasound (echo/ vascular) testing. Due to room size and safety concerns, children are not allowed in the ultrasound rooms during exams. Our front office staff cannot provide observation of children in our lobby area while testing is being conducted. An adult accompanying a patient to their appointment will only be allowed in the ultrasound room at the discretion of the ultrasound technician under special circumstances. We apologize for any inconvenience.  Follow-Up: At [facility], you and your health needs are our priority.  As part of our continuing mission to provide you with exceptional heart care, our providers are all part of one team.  This team includes your primary Cardiologist (physician) and Advanced Practice Providers or APPs (Physician Assistants and Nurse Practitioners) who all work together to provide you with the care you need, when you need it.  Your next appointment:   8 week(s)  Provider:   Liane Redman, PA-C. 6 month follow up with Randene Bustard MD.  We recommend signing up for the patient portal called "MyChart".  Sign up information is provided on this After Visit Summary.  MyChart is used to  connect with patients for Virtual Visits (Telemedicine).  Patients are able to view lab/test results, encounter notes, upcoming appointments, etc.  Non-urgent messages can be sent to your provider as well.   To learn more about what you can do with MyChart, go to [email or web address].   Other Instructions:   1st Floor: - Lobby - Registration  - Pharmacy  - Lab - Cafe  2nd Floor: - PV Lab - Diagnostic Testing (echo, CT, nuclear med)  3rd Floor: - Vacant  4th Floor: - TCTS (cardiothoracic surgery) - AFib Clinic - Structural Heart Clinic - Vascular Surgery  - Vascular Ultrasound  5th Floor: - HeartCare Cardiology (general and EP) - Clinical Pharmacy for coumadin, hypertension, lipid, weight-loss medications, and med management appointments    Valet parking services will be available as well.

## 2024-04-29 DIAGNOSIS — M546 Pain in thoracic spine: Secondary | ICD-10-CM | POA: Diagnosis not present

## 2024-04-29 DIAGNOSIS — M25531 Pain in right wrist: Secondary | ICD-10-CM | POA: Diagnosis not present

## 2024-04-29 DIAGNOSIS — Z79899 Other long term (current) drug therapy: Secondary | ICD-10-CM | POA: Diagnosis not present

## 2024-04-29 DIAGNOSIS — M5416 Radiculopathy, lumbar region: Secondary | ICD-10-CM | POA: Diagnosis not present

## 2024-04-29 DIAGNOSIS — M545 Low back pain, unspecified: Secondary | ICD-10-CM | POA: Diagnosis not present

## 2024-05-02 ENCOUNTER — Ambulatory Visit (HOSPITAL_COMMUNITY)
Admission: RE | Admit: 2024-05-02 | Discharge: 2024-05-02 | Disposition: A | Discharge status: Home / Self Care | Source: Ambulatory Visit | Attending: Cardiology | Admitting: Cardiology

## 2024-05-02 DIAGNOSIS — R079 Chest pain, unspecified: Secondary | ICD-10-CM | POA: Diagnosis not present

## 2024-05-02 DIAGNOSIS — I351 Nonrheumatic aortic (valve) insufficiency: Secondary | ICD-10-CM | POA: Diagnosis not present

## 2024-05-02 LAB — ECHOCARDIOGRAM COMPLETE
AR max vel: 2.72 cm2
AV Area VTI: 2.75 cm2
AV Area mean vel: 2.58 cm2
AV Mean grad: 10 mmHg
AV Peak grad: 16.4 mmHg
Ao pk vel: 2.03 m/s
Area-P 1/2: 2.99 cm2
P 1/2 time: 735 ms
S' Lateral: 2.7 cm

## 2024-05-03 DIAGNOSIS — M13831 Other specified arthritis, right wrist: Secondary | ICD-10-CM | POA: Diagnosis not present

## 2024-05-06 ENCOUNTER — Ambulatory Visit: Admitting: Cardiology

## 2024-05-06 ENCOUNTER — Ambulatory Visit: Payer: Self-pay | Admitting: Cardiology

## 2024-05-08 ENCOUNTER — Other Ambulatory Visit: Payer: Self-pay | Admitting: Orthopedic Surgery

## 2024-05-08 NOTE — Telephone Encounter (Signed)
 Called patient advised of below they verbalized understanding.

## 2024-05-08 NOTE — Telephone Encounter (Signed)
 Copied from CRM [id number]. Topic: Clinical - Medication Refill >> May 08, 2024  2:58 PM Blair Bumpers wrote: Medication: traZODone  (DESYREL ) 100 MG tablet  Has the patient contacted their pharmacy? No (Agent: If no, request that the patient contact the pharmacy for the refill. If patient does not wish to contact the pharmacy document the reason why and proceed with request.) (Agent: If yes, when and what did the pharmacy advise?)  This is the patient's preferred pharmacy:  [id number] [place], [place] - [address] [place] [address] Phone: [phone] Fax: [phone]   Is this the correct pharmacy for this prescription? Yes If no, delete pharmacy and type the correct one.   Has the prescription been filled recently? No  Is the patient out of the medication? Yes  Has the patient been seen for an appointment in the last year OR does the patient have an upcoming appointment? Yes  Can we respond through MyChart? Yes  Agent: Please be advised that Rx refills may take up to 3 business days. We ask that you follow-up with your pharmacy.

## 2024-05-08 NOTE — Telephone Encounter (Signed)
-----   Message from Debria Fang sent at 05/06/2024  9:38 AM EDT ----- Please tell patient that her echocardiogram showed EF (pumping function of the heart) was normal at 60-65%.  There were no regional wall motion abnormalities.  LV relaxes normally.  RV functions normally.  There is mild-moderate leakiness of the aortic valve and mild thickening of the aortic valve.  These do not need intervention at this time but we will continue to monitor with repeat echoes in the future.

## 2024-05-12 ENCOUNTER — Encounter: Payer: Self-pay | Admitting: Orthopedic Surgery

## 2024-05-14 ENCOUNTER — Encounter: Payer: Self-pay | Admitting: Nurse Practitioner

## 2024-05-14 ENCOUNTER — Ambulatory Visit: Attending: Nurse Practitioner | Admitting: Nurse Practitioner

## 2024-05-14 VITALS — BP 124/78 | HR 57 | Ht 69.0 in | Wt 194.0 lb

## 2024-05-14 DIAGNOSIS — I6523 Occlusion and stenosis of bilateral carotid arteries: Secondary | ICD-10-CM

## 2024-05-14 DIAGNOSIS — I2510 Atherosclerotic heart disease of native coronary artery without angina pectoris: Secondary | ICD-10-CM | POA: Diagnosis not present

## 2024-05-14 DIAGNOSIS — I351 Nonrheumatic aortic (valve) insufficiency: Secondary | ICD-10-CM

## 2024-05-14 DIAGNOSIS — I10 Essential (primary) hypertension: Secondary | ICD-10-CM

## 2024-05-14 DIAGNOSIS — E785 Hyperlipidemia, unspecified: Secondary | ICD-10-CM | POA: Diagnosis not present

## 2024-05-14 DIAGNOSIS — I350 Nonrheumatic aortic (valve) stenosis: Secondary | ICD-10-CM

## 2024-05-14 MED ORDER — TRAZODONE HCL 100 MG PO TABS: 100.0000 mg | ORAL_TABLET | Freq: Every day | ORAL | 3 refills | Status: DC

## 2024-05-14 NOTE — Patient Instructions (Signed)
 Medication Instructions:  Your physician recommends that you continue on your current medications as directed. Please refer to the Current Medication list given to you today.  *If you need a refill on your cardiac medications before your next appointment, please call your pharmacy*  Lab Work: Fasting lipid panel & CMET in 1 month  Testing/Procedures: NONE ordered at this time of appointment    Follow-Up: At [facility], you and your health needs are our priority.  As part of our continuing mission to provide you with exceptional heart care, our providers are all part of one team.  This team includes your primary Cardiologist (physician) and Advanced Practice Providers or APPs (Physician Assistants and Nurse Practitioners) who all work together to provide you with the care you need, when you need it.  Your next appointment:    Keep follow up   Provider:   Randene Bustard, MD    We recommend signing up for the patient portal called "MyChart".  Sign up information is provided on this After Visit Summary.  MyChart is used to connect with patients for Virtual Visits (Telemedicine).  Patients are able to view lab/test results, encounter notes, upcoming appointments, etc.  Non-urgent messages can be sent to your provider as well.   To learn more about what you can do with MyChart, go to [email or web address].

## 2024-05-14 NOTE — Telephone Encounter (Signed)
 Patient has request refill on medication that hasn't been refilled by PCP Arnetha Bhat, NP . Medication pend and sent to [place], NP due to PCP being out of office.

## 2024-05-14 NOTE — Progress Notes (Signed)
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   SpO2 97%   BMI 28.65 kg/m   GEN: Well nourished, well developed, in no acute distress. HEENT: normal. Neck: Supple, no JVD, carotid bruits, or masses. Cardiac: RRR, no murmurs, rubs, or gallops. No clubbing, cyanosis, edema.  Radials/DP/PT 2+ and equal bilaterally.  Respiratory:  Respirations regular and unlabored, clear to auscultation bilaterally. GI: Soft, nontender, nondistended, BS + x 4. MS: no deformity or atrophy. Skin: warm and dry, no rash. Neuro:  Strength and sensation are intact. Psych: Normal affect.  Accessory Clinical Findings    ECG personally reviewed by me today -    - no EKG in office today.   Lab Results  Component Value Date   WBC 5.6 03/23/2024   HGB 11.7 (L) 03/23/2024   HCT 35.3 (L) 03/23/2024   MCV 87.4 03/23/2024   PLT 174 03/23/2024   Lab Results  Component Value Date   CREATININE 1.19 (H) 03/23/2024   BUN 15 03/23/2024   NA 141 03/23/2024   K 4.1  03/23/2024   CL 106 03/23/2024   CO2 28 03/23/2024   Lab Results  Component Value Date   ALT 18 01/11/2024   AST 26 01/11/2024   ALKPHOS 80 08/09/2022   BILITOT 0.4 01/11/2024   Lab Results  Component Value Date   CHOL 202 (H) 01/11/2024   HDL 72 01/11/2024   LDLCALC 110 (H) 01/11/2024   TRIG 98 01/11/2024   CHOLHDL 2.8 01/11/2024    Lab Results  Component Value Date   HGBA1C 5.8 (H) 01/20/2012    Assessment & Plan    1. CAD: Coronary CT angiogram in 07/2023 revealed coronary calcium  score of 1195 (90 percentile, severe RCA stenosis, mild LAD stenosis.  She continued to report left-sided chest pain, exertional dyspnea.  She underwent LHC in April 2025, which revealed 80% stenosis in the proximal RCA-1, 80% stenosis in proximal RCA-2, and 50% mid RCA stenosis, s/p balloon angioplasty, shockwave lithotripsy, DES. Consider musculoskeletal etiology.  Reviewed emergency precautions.  We discussed possibly increasing Imdur, she wishes to defer for now.  Will continue to monitor symptoms.  Continue aspirin , Plavix , amlodipine -olmesartan , Imdur, and Crestor .  2. Aortic valve regurgitation/aortic stenosis:  Echocardiogram in 04/2024 showed EF 60 to 65%, normal LV function, no RWMA, mild concentric LVH, normal RV systolic function, mild to moderate aortic valve regurgitation, mild aortic valve stenosis, mean gradient 10 mmHg. Euvolemic and well compensated on exam.  Overall asymptomatic.  Consider repeat echocardiogram in 1 year for routine monitoring.  3. Carotid artery stenosis: Carotid ultrasound in 03/2024 revealed 1 to 39% BICA stenosis.  Asymptomatic.  Consider repeat ultrasound as clinically indicated.  Continue Crestor .  4. Hypertension: BP well controlled. Continue current antihypertensive regimen.   5. Hyperlipidemia: LDL was 110 in 12/2023.  Transition to Crestor .  Will repeat fasting lipids, CMET in 1 month.   6. Disposition: Follow-up as scheduled with Dr. Addie Holstein in 08/2024.          Jude Norton, NP 05/14/2024, 11:59 AM

## 2024-05-15 ENCOUNTER — Ambulatory Visit: Payer: Self-pay | Admitting: Cardiology
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
## 2024-05-17 ENCOUNTER — Other Ambulatory Visit: Payer: Self-pay | Admitting: Orthopedic Surgery

## 2024-05-17 ENCOUNTER — Encounter: Payer: Self-pay | Admitting: Physician Assistant

## 2024-05-17 ENCOUNTER — Ambulatory Visit: Admitting: Physician Assistant

## 2024-05-17 VITALS — BP 122/70 | HR 58 | Ht 69.0 in | Wt 191.0 lb

## 2024-05-17 DIAGNOSIS — Z800 Family history of malignant neoplasm of digestive organs: Secondary | ICD-10-CM | POA: Diagnosis not present

## 2024-05-17 DIAGNOSIS — K5909 Other constipation: Secondary | ICD-10-CM | POA: Diagnosis not present

## 2024-05-17 DIAGNOSIS — Z860100 Personal history of colon polyps, unspecified: Secondary | ICD-10-CM

## 2024-05-17 DIAGNOSIS — Z720 Tobacco use: Secondary | ICD-10-CM

## 2024-05-17 NOTE — Patient Instructions (Signed)
 Follow-up in Oct to discuss scheduling colonoscopy.   Office will contact you at a later time to schedule.   _______________________________________________________  If your blood pressure at your visit was 140/90 or greater, please contact your primary care physician to follow up on this.  _______________________________________________________  If you are age 66 or older, your body mass index should be between 23-30. Your Body mass index is 28.21 kg/m. If this is out of the aforementioned range listed, please consider follow up with your Primary Care Provider.  If you are age 47 or younger, your body mass index should be between 19-25. Your Body mass index is 28.21 kg/m. If this is out of the aformentioned range listed, please consider follow up with your Primary Care Provider.   ________________________________________________________  The [facility] providers would like to encourage you to use MYCHART to communicate with providers for non-urgent requests or questions.  Due to long hold times on the telephone, sending your provider a message by [facility] may be a faster and more efficient way to get a response.  Please allow 48 business hours for a response.  Please remember that this is for non-urgent requests.  _______________________________________________________  Thank you for choosing me and [facility].

## 2024-05-18 ENCOUNTER — Encounter: Payer: Self-pay | Admitting: Physician Assistant

## 2024-05-21 DIAGNOSIS — M5416 Radiculopathy, lumbar region: Secondary | ICD-10-CM | POA: Diagnosis not present

## 2024-05-23 ENCOUNTER — Other Ambulatory Visit: Payer: Self-pay | Admitting: "Endocrinology

## 2024-05-24 ENCOUNTER — Telehealth: Payer: Self-pay

## 2024-05-24 DIAGNOSIS — I2510 Atherosclerotic heart disease of native coronary artery without angina pectoris: Secondary | ICD-10-CM | POA: Diagnosis not present

## 2024-05-24 DIAGNOSIS — E785 Hyperlipidemia, unspecified: Secondary | ICD-10-CM | POA: Diagnosis not present

## 2024-05-24 NOTE — Telephone Encounter (Signed)
 Received a phone call from the lab stating that a patient left their driver's license at the desk. Pt was called and notified. Pt plans to come by today 6/6 to pick it up.

## 2024-05-25 LAB — COMPREHENSIVE METABOLIC PANEL WITH GFR
ALT: 26 IU/L (ref 0–32)
AST: 35 IU/L (ref 0–40)
Albumin: 4.3 g/dL (ref 3.9–4.9)
Alkaline Phosphatase: 82 IU/L (ref 44–121)
BUN/Creatinine Ratio: 17 (ref 12–28)
BUN: 20 mg/dL (ref 8–27)
Bilirubin Total: 0.2 mg/dL (ref 0.0–1.2)
CO2: 23 mmol/L (ref 20–29)
Calcium: 9.7 mg/dL (ref 8.7–10.3)
Chloride: 96 mmol/L (ref 96–106)
Creatinine, Ser: 1.16 mg/dL — ABNORMAL HIGH (ref 0.57–1.00)
Globulin, Total: 2.3 g/dL (ref 1.5–4.5)
Glucose: 78 mg/dL (ref 70–99)
Potassium: 4.6 mmol/L (ref 3.5–5.2)
Sodium: 136 mmol/L (ref 134–144)
Total Protein: 6.6 g/dL (ref 6.0–8.5)
eGFR: 52 mL/min/{1.73_m2} — ABNORMAL LOW (ref >59)

## 2024-05-25 LAB — LIPID PANEL
Chol/HDL Ratio: 3.1 ratio (ref 0.0–4.4)
Cholesterol, Total: 148 mg/dL (ref 100–199)
HDL: 48 mg/dL (ref >39)
LDL Chol Calc (NIH): 79 mg/dL (ref 0–99)
Triglycerides: 116 mg/dL (ref 0–149)
VLDL Cholesterol Cal: 21 mg/dL (ref 5–40)

## 2024-05-26 ENCOUNTER — Ambulatory Visit: Payer: Self-pay | Admitting: Nurse Practitioner

## 2024-05-26 DIAGNOSIS — Z79899 Other long term (current) drug therapy: Secondary | ICD-10-CM

## 2024-05-26 DIAGNOSIS — E785 Hyperlipidemia, unspecified: Secondary | ICD-10-CM

## 2024-05-26 DIAGNOSIS — I10 Essential (primary) hypertension: Secondary | ICD-10-CM

## 2024-05-27 ENCOUNTER — Other Ambulatory Visit: Payer: Self-pay

## 2024-05-27 ENCOUNTER — Other Ambulatory Visit: Payer: Self-pay | Admitting: *Deleted

## 2024-05-27 DIAGNOSIS — E890 Postprocedural hypothyroidism: Secondary | ICD-10-CM

## 2024-05-27 DIAGNOSIS — Z79899 Other long term (current) drug therapy: Secondary | ICD-10-CM

## 2024-05-27 DIAGNOSIS — E785 Hyperlipidemia, unspecified: Secondary | ICD-10-CM

## 2024-05-27 DIAGNOSIS — I10 Essential (primary) hypertension: Secondary | ICD-10-CM

## 2024-05-27 MED ORDER — EZETIMIBE 10 MG PO TABS: 10.0000 mg | ORAL_TABLET | Freq: Every day | ORAL | 3 refills | Status: AC

## 2024-05-29 NOTE — Telephone Encounter (Signed)
 Spoke with pt. Pt was notified of lab results and recommendations to start Zetia 10 mg daily and repeat lab work in 6-8 weeks. Pt voiced understanding and is also aware that results were sent to mychart.

## 2024-05-30 DIAGNOSIS — E890 Postprocedural hypothyroidism: Secondary | ICD-10-CM | POA: Diagnosis not present

## 2024-06-03 ENCOUNTER — Ambulatory Visit: Payer: Medicare Other | Admitting: "Endocrinology

## 2024-06-03 ENCOUNTER — Encounter: Payer: Self-pay | Admitting: "Endocrinology

## 2024-06-03 VITALS — BP 104/52 | HR 52 | Ht 69.0 in | Wt 194.8 lb

## 2024-06-03 DIAGNOSIS — E890 Postprocedural hypothyroidism: Secondary | ICD-10-CM
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
## 2024-06-03 NOTE — Progress Notes (Signed)
 Agree with the assessment and plan as outlined by Brigitte Canard, PA-C.

## 2024-06-05 ENCOUNTER — Encounter: Payer: Self-pay | Admitting: Orthopedic Surgery

## 2024-06-05 MED ORDER — ISOSORBIDE MONONITRATE ER 30 MG PO TB24: 30.0000 mg | ORAL_TABLET | Freq: Every day | ORAL | 0 refills | Status: DC

## 2024-06-06 ENCOUNTER — Other Ambulatory Visit: Payer: Self-pay | Admitting: Adult Health

## 2024-06-06 NOTE — Telephone Encounter (Signed)
 Patient has request 90 day supply of medication Trazodone . Prescription just refilled 05/14/2024.

## 2024-06-13 ENCOUNTER — Encounter: Payer: Medicare Other | Admitting: Orthopedic Surgery

## 2024-06-13 NOTE — Progress Notes (Signed)
 This encounter was created in error - please disregard.

## 2024-06-20 ENCOUNTER — Encounter: Payer: Self-pay | Admitting: Orthopedic Surgery

## 2024-06-20 ENCOUNTER — Ambulatory Visit: Admitting: Orthopedic Surgery

## 2024-06-20 VITALS — BP 128/58 | HR 62 | Temp 98.1°F | Resp 10 | Ht 69.0 in | Wt 195.6 lb

## 2024-06-20 DIAGNOSIS — I10 Essential (primary) hypertension: Secondary | ICD-10-CM

## 2024-06-20 DIAGNOSIS — E785 Hyperlipidemia, unspecified: Secondary | ICD-10-CM

## 2024-06-20 DIAGNOSIS — I25118 Atherosclerotic heart disease of native coronary artery with other forms of angina pectoris: Secondary | ICD-10-CM

## 2024-06-20 DIAGNOSIS — R102 Pelvic and perineal pain: Secondary | ICD-10-CM

## 2024-06-20 DIAGNOSIS — I6523 Occlusion and stenosis of bilateral carotid arteries: Secondary | ICD-10-CM | POA: Diagnosis not present

## 2024-06-20 DIAGNOSIS — F17200 Nicotine dependence, unspecified, uncomplicated: Secondary | ICD-10-CM | POA: Diagnosis not present

## 2024-06-20 LAB — COMPREHENSIVE METABOLIC PANEL WITH GFR
AG Ratio: 1.8 (calc) (ref 1.0–2.5)
ALT: 33 U/L — ABNORMAL HIGH (ref 6–29)
AST: 45 U/L — ABNORMAL HIGH (ref 10–35)
Albumin: 4.4 g/dL (ref 3.6–5.1)
Alkaline phosphatase (APISO): 72 U/L (ref 37–153)
BUN/Creatinine Ratio: 14 (calc) (ref 6–22)
BUN: 16 mg/dL (ref 7–25)
CO2: 30 mmol/L (ref 20–32)
Calcium: 9.8 mg/dL (ref 8.6–10.4)
Chloride: 102 mmol/L (ref 98–110)
Creat: 1.13 mg/dL — ABNORMAL HIGH (ref 0.50–1.05)
Globulin: 2.4 g/dL (ref 1.9–3.7)
Glucose, Bld: 82 mg/dL (ref 65–99)
Potassium: 4.8 mmol/L (ref 3.5–5.3)
Sodium: 137 mmol/L (ref 135–146)
Total Bilirubin: 0.3 mg/dL (ref 0.2–1.2)
Total Protein: 6.8 g/dL (ref 6.1–8.1)
eGFR: 54 mL/min/{1.73_m2} — ABNORMAL LOW (ref >=60)
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
## 2024-06-20 NOTE — Patient Instructions (Signed)
 CT chest to rule out lung cancer ordered with [facility]> [phone]> call them if you have not been contacted in 1-2 weeks

## 2024-06-23 ENCOUNTER — Ambulatory Visit: Payer: Self-pay | Admitting: Orthopedic Surgery

## 2024-06-23 DIAGNOSIS — R748 Abnormal levels of other serum enzymes: Secondary | ICD-10-CM

## 2024-07-03 ENCOUNTER — Ambulatory Visit
Admission: RE | Admit: 2024-07-03 | Discharge: 2024-07-03 | Disposition: A | Discharge status: Home / Self Care | Payer: Self-pay | Source: Ambulatory Visit | Attending: Nurse Practitioner | Admitting: Nurse Practitioner

## 2024-07-03 ENCOUNTER — Other Ambulatory Visit: Payer: Self-pay | Admitting: Nurse Practitioner

## 2024-07-03 ENCOUNTER — Inpatient Hospital Stay
Admission: RE | Admit: 2024-07-03 | Discharge: 2024-07-03 | Disposition: A | Discharge status: Home / Self Care | Source: Ambulatory Visit | Attending: Orthopedic Surgery | Admitting: Orthopedic Surgery

## 2024-07-03 DIAGNOSIS — S99912A Unspecified injury of left ankle, initial encounter: Secondary | ICD-10-CM

## 2024-07-03 DIAGNOSIS — S99922A Unspecified injury of left foot, initial encounter: Secondary | ICD-10-CM

## 2024-07-03 DIAGNOSIS — F17200 Nicotine dependence, unspecified, uncomplicated: Secondary | ICD-10-CM

## 2024-07-03 DIAGNOSIS — Z122 Encounter for screening for malignant neoplasm of respiratory organs: Secondary | ICD-10-CM | POA: Diagnosis not present

## 2024-07-03 DIAGNOSIS — Z87891 Personal history of nicotine dependence: Secondary | ICD-10-CM | POA: Diagnosis not present

## 2024-07-15 ENCOUNTER — Other Ambulatory Visit

## 2024-07-15 DIAGNOSIS — R748 Abnormal levels of other serum enzymes: Secondary | ICD-10-CM

## 2024-07-15 LAB — COMPREHENSIVE METABOLIC PANEL WITH GFR
AG Ratio: 1.9 (calc) (ref 1.0–2.5)
ALT: 18 U/L (ref 6–29)
AST: 26 U/L (ref 10–35)
Albumin: 4.3 g/dL (ref 3.6–5.1)
Alkaline phosphatase (APISO): 69 U/L (ref 37–153)
BUN/Creatinine Ratio: 20 (calc) (ref 6–22)
BUN: 24 mg/dL (ref 7–25)
CO2: 25 mmol/L (ref 20–32)
Calcium: 9.7 mg/dL (ref 8.6–10.4)
Chloride: 104 mmol/L (ref 98–110)
Creat: 1.21 mg/dL — ABNORMAL HIGH (ref 0.50–1.05)
Globulin: 2.3 g/dL (ref 1.9–3.7)
Glucose, Bld: 83 mg/dL (ref 65–99)
Potassium: 4.1 mmol/L (ref 3.5–5.3)
Sodium: 140 mmol/L (ref 135–146)
Total Bilirubin: 0.4 mg/dL (ref 0.2–1.2)
Total Protein: 6.6 g/dL (ref 6.1–8.1)
eGFR: 49 mL/min/1.73m2 — ABNORMAL LOW (ref >=60)

## 2024-07-16 ENCOUNTER — Ambulatory Visit: Payer: Self-pay | Admitting: Orthopedic Surgery

## 2024-07-30 DIAGNOSIS — M546 Pain in thoracic spine: Secondary | ICD-10-CM | POA: Diagnosis not present

## 2024-07-30 DIAGNOSIS — Z5181 Encounter for therapeutic drug level monitoring: Secondary | ICD-10-CM | POA: Diagnosis not present

## 2024-07-30 DIAGNOSIS — M25551 Pain in right hip: Secondary | ICD-10-CM | POA: Diagnosis not present

## 2024-07-30 DIAGNOSIS — M5416 Radiculopathy, lumbar region: Secondary | ICD-10-CM | POA: Diagnosis not present

## 2024-08-13 ENCOUNTER — Other Ambulatory Visit: Payer: Self-pay | Admitting: Orthopedic Surgery

## 2024-08-13 DIAGNOSIS — Z720 Tobacco use: Secondary | ICD-10-CM

## 2024-08-22 ENCOUNTER — Other Ambulatory Visit: Payer: Self-pay | Admitting: "Endocrinology

## 2024-08-31 ENCOUNTER — Other Ambulatory Visit: Payer: Self-pay | Admitting: Cardiology

## 2024-09-10 ENCOUNTER — Ambulatory Visit: Admitting: Cardiology

## 2024-09-11 ENCOUNTER — Ambulatory Visit

## 2024-09-11 ENCOUNTER — Ambulatory Visit: Attending: Physician Assistant | Admitting: Physician Assistant

## 2024-09-11 ENCOUNTER — Encounter: Payer: Self-pay | Admitting: Physician Assistant

## 2024-09-11 VITALS — BP 118/60 | HR 60 | Ht 69.0 in | Wt 200.0 lb

## 2024-09-11 DIAGNOSIS — I25119 Atherosclerotic heart disease of native coronary artery with unspecified angina pectoris: Secondary | ICD-10-CM | POA: Diagnosis not present

## 2024-09-11 DIAGNOSIS — E785 Hyperlipidemia, unspecified: Secondary | ICD-10-CM | POA: Diagnosis not present

## 2024-09-11 DIAGNOSIS — R002 Palpitations: Secondary | ICD-10-CM | POA: Diagnosis not present

## 2024-09-11 DIAGNOSIS — I10 Essential (primary) hypertension: Secondary | ICD-10-CM | POA: Diagnosis not present

## 2024-09-11 NOTE — Progress Notes (Unsigned)
 Cardiology Office Note   Date:  09/11/2024  ID:  Jenipher Havel, DOB 03/02/1956, MRN [id number] PCP: Gil Greig BRAVO, NP  [facility] Providers Cardiologist:  Alm Clay, MD { Click to update primary MD,subspecialty MD or APP then REFRESH:1}    History of Present Illness Penny Cox is a 68 y.o. female with past medical history of CAD s/p DES to RCA in 4/25, coronary artery stenosis, aortic valve regurgitation, hypertension, hyperlipidemia, fibromyalgia, anxiety and depression.  Patient was previously followed by [facility] medical [facility].  CTA obtained in August 2024 revealed coronary calcium  score 1195 which placed the patient in 98th percentile, severe RCA lesion, mild LAD stenosis.  Due to persistent pain, she eventually underwent cardiac catheterization in April 2025 which revealed 80% stenosis in proximal RCA followed by another 80% proximal RCA lesion, 50% mid RCA lesion, proximal RCA lesions were treated with balloon angioplasty, shockwave lithotripsy and single drug-eluting stent.  Postprocedure, patient was placed on aspirin  and Plavix .  Beta-blocker was deferred in the setting of resting bradycardia.  Carotid ultrasound in April 2025 showed 1 to 39% bilateral ICA disease.  Echocardiogram in May 2025 showed EF 60 to 65%, normal LV function, no regional wall motion abnormality, mild concentric LVH, normal RV, mild to moderate AI, mild aortic stenosis.  Patient was last seen by Damien Braver on 05/14/2024 at which time she was doing well from the cardiac standpoint, she did mention a soreness in her chest however symptom improved with massages.  Repeat lipid panel obtained on 05/24/2024 showed LDL 79, Zetia  10 mg daily was added.  Repeat lipid panel obtained after a few days showed LDL has improved to 58.  Recent blood work in July showed normal liver enzyme.  Patient presents today for follow-up.  She still occasionally has some chest discomfort however is quite rare and do not have clear  correlation with exertion.  She says a lot of her pain chronic due to fibromyalgia.  She has no significant shortness of breath.  After October 4, she may stop aspirin  and continue Plavix  long-term.  She does complain of palpitation for years, symptom typically lasted for a few seconds and described as a tachypalpitation.  I recommended a 1 week heart monitor to further assess.  If the symptom is more related to [facility] and PVCs, then no further medication adjustment is needed as long as PVC burden is not significant.  He she can follow-up with Dr. Clay in 4 to 6 months, earlier if heart monitor shows significant arrhythmia.  ROS: ***  Studies Reviewed      *** Risk Assessment/Calculations {Does this patient have ATRIAL FIBRILLATION?[phone]}         Physical Exam VS:  BP 118/60   Pulse 60   Ht 5' 9 (1.753 m)   Wt 200 lb (90.7 kg)   SpO2 97%   BMI 29.53 kg/m        Wt Readings from Last 3 Encounters:  09/11/24 200 lb (90.7 kg)  06/20/24 195 lb 9.6 oz (88.7 kg)  06/03/24 194 lb 12.8 oz (88.4 kg)    GEN: Well nourished, well developed in no acute distress NECK: No JVD; No carotid bruits CARDIAC: ***RRR, no murmurs, rubs, gallops RESPIRATORY:  Clear to auscultation without rales, wheezing or rhonchi  ABDOMEN: Soft, non-tender, non-distended EXTREMITIES:  No edema; No deformity   ASSESSMENT AND PLAN ***    {Are you ordering a CV Procedure (e.g. stress test, cath, DCCV, TEE, etc)?   Press F2        :  [id number]}  Dispo: ***  Signed, Pecolia Marando, PA

## 2024-09-11 NOTE — Progress Notes (Unsigned)
 Enrolled patient for a 7 day Zio XT monitor to be mailed to patients home.

## 2024-09-11 NOTE — Patient Instructions (Signed)
 Thank you for choosing [facility]!     Medication Instructions:  After Oct 4th, you may stop taking the aspirin  81mg .  *If you need a refill on your cardiac medications before your next appointment, please call your pharmacy*   Lab Work: No labs were ordered during today's visit.  If you have labs (blood work) drawn today and your tests are completely normal, you will receive your results only by: MyChart Message (if you have MyChart) OR A paper copy in the mail If you have any lab test that is abnormal or we need to change your treatment, we will call you to review the results.   Testing/Procedures:  ZIO XT- Long Term Monitor Instructions   Your physician has requested you wear your ZIO patch monitor____7___days.   This is a single patch monitor.  [employer] supplies one patch monitor per enrollment.  Additional stickers are not available.   Please do not apply patch if you will be having a Nuclear Stress Test, Echocardiogram, Cardiac CT, MRI, or Chest Xray during the time frame you would be wearing the monitor. The patch cannot be worn during these tests.  You cannot remove and re-apply the ZIO XT patch monitor.   Your ZIO patch monitor will be sent USPS Priority mail from [facility] directly to your home address. The monitor may also be mailed to a PO BOX if home delivery is not available.   It may take 3-5 days to receive your monitor after you have been enrolled.   Once you have received you monitor, please review enclosed instructions.  Your monitor has already been registered assigning a specific monitor serial # to you.   Applying the monitor   Shave hair from upper left chest.   Hold abrader disc by orange tab.  Rub abrader in 40 strokes over left upper chest as indicated in your monitor instructions.   Clean area with 4 enclosed alcohol pads .  Use all pads to assure are is cleaned thoroughly.  Let dry.   Apply patch as indicated in monitor  instructions.  Patch will be place under collarbone on left side of chest with arrow pointing upward.   Rub patch adhesive wings for 2 minutes.Remove white label marked 1.  Remove white label marked 2.  Rub patch adhesive wings for 2 additional minutes.   While looking in a mirror, press and release button in center of patch.  A small green light will flash 3-4 times .  This will be your only indicator the monitor has been turned on.     Do not shower for the first 24 hours.  You may shower after the first 24 hours.   Press button if you feel a symptom. You will hear a small click.  Record Date, Time and Symptom in the Patient Log Book.   When you are ready to remove patch, follow instructions on last 2 pages of Patient Log Book.  Stick patch monitor onto last page of Patient Log Book.   Place Patient Log Book in [place] box.  Use locking tab on box and tape box closed securely.  The [employer] has [employer] on it.  Please place in mailbox as soon as possible.  Your physician should have your test results approximately 7 days after the monitor has been mailed back to [facility].   Call [facility] Customer Care at [phone] if you have questions regarding your ZIO XT patch monitor.  Call them immediately if  you see an orange light blinking on your monitor.   If your monitor falls off in less than 4 days contact our Monitor department at [phone].  If your monitor becomes loose or falls off after 4 days call [employer] at [phone] for suggestions on securing your monitor.      Your next appointment:   4-6 month(s)   Provider:   Alm Clay, MD     Follow-Up: At [facility], you and your health needs are our priority.  As part of our continuing mission to provide you with exceptional heart care, we have created designated Provider Care Teams.  These Care Teams include your primary Cardiologist (physician) and Advanced Practice Providers (APPs  -  Physician Assistants and Nurse Practitioners) who all work together to provide you with the care you need, when you need it. We recommend signing up for the patient portal called MyChart.  Sign up information is provided on this After Visit Summary.  MyChart is used to connect with patients for Virtual Visits (Telemedicine).  Patients are able to view lab/test results, encounter notes, upcoming appointments, etc.  Non-urgent messages can be sent to your provider as well.   To learn more about what you can do with MyChart, go to [email or web address].

## 2024-09-12 ENCOUNTER — Other Ambulatory Visit: Payer: Self-pay | Admitting: Orthopedic Surgery

## 2024-09-19 ENCOUNTER — Other Ambulatory Visit: Payer: Self-pay | Admitting: Orthopedic Surgery

## 2024-10-14 ENCOUNTER — Other Ambulatory Visit: Payer: Self-pay | Admitting: "Endocrinology

## 2024-10-17 ENCOUNTER — Other Ambulatory Visit: Payer: Self-pay | Admitting: *Deleted

## 2024-10-17 DIAGNOSIS — E890 Postprocedural hypothyroidism: Secondary | ICD-10-CM

## 2024-10-17 MED ORDER — LEVOTHYROXINE SODIUM 112 MCG PO TABS: 112.0000 ug | ORAL_TABLET | Freq: Every day | ORAL | 3 refills | Status: AC

## 2024-11-09 ENCOUNTER — Other Ambulatory Visit: Payer: Self-pay | Admitting: "Endocrinology

## 2024-12-02 ENCOUNTER — Other Ambulatory Visit: Payer: Self-pay | Admitting: Orthopedic Surgery

## 2024-12-02 MED ORDER — ISOSORBIDE MONONITRATE ER 30 MG PO TB24: 30.0000 mg | ORAL_TABLET | Freq: Every day | ORAL | 0 refills | Status: DC

## 2024-12-02 NOTE — Telephone Encounter (Signed)
 Copied from CRM #[id number]. Topic: Clinical - Medication Refill >> Dec 02, 2024 12:46 PM Shamecia H wrote: Medication:  isosorbide  mononitrate (IMDUR ) 30 MG 24 hr tablet  Has the patient contacted their pharmacy? Yes (Agent: If no, request that the patient contact the pharmacy for the refill. If patient does not wish to contact the pharmacy document the reason why and proceed with request.) (Agent: If yes, when and what did the pharmacy advise?)  This is the patient's preferred pharmacy:  [facility] [phone] - [place], [place] - [address] AT [facility] [address] [place] [address] Phone: [phone] Fax: [phone]  Is this the correct pharmacy for this prescription? Yes If no, delete pharmacy and type the correct one.   Has the prescription been filled recently? No  Is the patient out of the medication? No  Has the patient been seen for an appointment in the last year OR does the patient have an upcoming appointment? Yes  Can we respond through MyChart? Yes  Agent: Please be advised that Rx refills may take up to 3 business days. We ask that you follow-up with your pharmacy.

## 2024-12-04 DIAGNOSIS — R002 Palpitations: Secondary | ICD-10-CM

## 2024-12-06 ENCOUNTER — Other Ambulatory Visit: Payer: Self-pay | Admitting: Orthopedic Surgery

## 2024-12-10 ENCOUNTER — Ambulatory Visit: Payer: Self-pay | Admitting: Physician Assistant

## 2024-12-12 ENCOUNTER — Other Ambulatory Visit: Payer: Self-pay | Admitting: Orthopedic Surgery

## 2024-12-25 ENCOUNTER — Ambulatory Visit: Admitting: Physician Assistant

## 2024-12-26 ENCOUNTER — Ambulatory Visit (INDEPENDENT_AMBULATORY_CARE_PROVIDER_SITE_OTHER): Payer: Self-pay | Admitting: Orthopedic Surgery

## 2024-12-26 ENCOUNTER — Encounter: Payer: Self-pay | Admitting: Orthopedic Surgery

## 2024-12-26 VITALS — BP 124/60 | HR 76 | Temp 97.4°F | Ht 69.0 in | Wt 215.6 lb

## 2024-12-26 DIAGNOSIS — Z152 Genetic susceptibility to obesity: Secondary | ICD-10-CM

## 2024-12-26 DIAGNOSIS — E66811 Obesity, class 1: Secondary | ICD-10-CM

## 2024-12-26 DIAGNOSIS — Z6831 Body mass index (BMI) 31.0-31.9, adult: Secondary | ICD-10-CM

## 2024-12-26 DIAGNOSIS — N1831 Chronic kidney disease, stage 3a: Secondary | ICD-10-CM

## 2024-12-26 DIAGNOSIS — F17200 Nicotine dependence, unspecified, uncomplicated: Secondary | ICD-10-CM | POA: Diagnosis not present

## 2024-12-26 DIAGNOSIS — Z1211 Encounter for screening for malignant neoplasm of colon: Secondary | ICD-10-CM | POA: Diagnosis not present

## 2024-12-26 DIAGNOSIS — G8929 Other chronic pain: Secondary | ICD-10-CM

## 2024-12-26 DIAGNOSIS — E8882 Obesity due to disruption of MC4R pathway: Secondary | ICD-10-CM

## 2024-12-26 DIAGNOSIS — I25118 Atherosclerotic heart disease of native coronary artery with other forms of angina pectoris: Secondary | ICD-10-CM

## 2024-12-26 DIAGNOSIS — M546 Pain in thoracic spine: Secondary | ICD-10-CM | POA: Diagnosis not present

## 2024-12-26 DIAGNOSIS — I10 Essential (primary) hypertension: Secondary | ICD-10-CM | POA: Diagnosis not present
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
## 2024-12-26 NOTE — Patient Instructions (Signed)
 Schedule lab visit to have fasting lab work done within next month> no food 12 hours before blood drawn, water and black coffee ok  Referral made to [facility] for colonoscopy>> needs to be done in 2026  Schedule annual wellness visit with me in 2026

## 2024-12-30 ENCOUNTER — Ambulatory Visit: Attending: Nurse Practitioner | Admitting: Nurse Practitioner

## 2024-12-30 ENCOUNTER — Encounter: Payer: Self-pay | Admitting: Nurse Practitioner

## 2024-12-30 VITALS — BP 140/72 | HR 69 | Ht 69.0 in | Wt 216.0 lb

## 2024-12-30 DIAGNOSIS — I351 Nonrheumatic aortic (valve) insufficiency: Secondary | ICD-10-CM | POA: Diagnosis not present

## 2024-12-30 DIAGNOSIS — I10 Essential (primary) hypertension: Secondary | ICD-10-CM

## 2024-12-30 DIAGNOSIS — I350 Nonrheumatic aortic (valve) stenosis: Secondary | ICD-10-CM

## 2024-12-30 DIAGNOSIS — E785 Hyperlipidemia, unspecified: Secondary | ICD-10-CM

## 2024-12-30 DIAGNOSIS — I2510 Atherosclerotic heart disease of native coronary artery without angina pectoris: Secondary | ICD-10-CM

## 2024-12-30 DIAGNOSIS — R002 Palpitations: Secondary | ICD-10-CM | POA: Diagnosis not present

## 2024-12-30 DIAGNOSIS — I6523 Occlusion and stenosis of bilateral carotid arteries: Secondary | ICD-10-CM

## 2024-12-30 MED ORDER — ISOSORBIDE MONONITRATE ER 60 MG PO TB24: 60.0000 mg | ORAL_TABLET | Freq: Every day | ORAL | 3 refills | Status: AC

## 2024-12-30 NOTE — Progress Notes (Signed)
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   MELATONIN PO Take 15 mg by mouth at bedtime.     Multiple Vitamin (MULTIVITAMIN ADULT PO) Take 1 tablet by mouth daily.     nitroGLYCERIN  (NITROLINGUAL ) 0.4 MG/SPRAY spray Place 1 spray under the tongue every 5 (five) minutes x 3 doses as needed for chest pain.     Oxycodone  HCl 10 MG TABS Take 10 mg by mouth 3 (three) times daily as needed.     pantoprazole  (PROTONIX ) 40 MG tablet TAKE 1 TABLET BY MOUTH EVERY DAY 90 tablet 2   polyethylene glycol (MIRALAX  / GLYCOLAX ) 17 g packet Take 17 g by mouth daily.     rosuvastatin  (CRESTOR ) 40 MG tablet Take 1 tablet (40 mg total) by mouth daily. 90 tablet 3   traZODone  (DESYREL ) 100 MG tablet TAKE 1 TABLET BY MOUTH EVERY NIGHT AT BEDTIME 90 tablet 0   vitamin E 180 MG (400 UNITS) capsule Take 400 Units by mouth 2 (two) times daily.     No current facility-administered medications for this  visit.     Review of Systems    She denies pnd, orthopnea, n, v, dizziness, syncope, edema, weight gain, or early satiety. All other systems reviewed and are otherwise negative except as noted above.   Physical Exam    VS:  BP (!) 140/72   Pulse 69   Ht 5' 9 (1.753 m)   Wt 216 lb (98 kg)   SpO2 97%   BMI 31.90 kg/m   GEN: Well nourished, well developed, in no acute distress. HEENT: normal. Neck: Supple, no JVD, L carotid bruit, no masses. Cardiac: RRR, no murmurs, rubs, or gallops. No clubbing, cyanosis, edema.  Radials/DP/PT 2+ and equal bilaterally.  Respiratory:  Respirations regular and unlabored, clear to auscultation bilaterally. GI: Soft, nontender, nondistended, BS + x 4. MS: no deformity or atrophy. Skin: warm and dry, no rash. Neuro:  Strength and sensation are intact. Psych: Normal affect.  Accessory Clinical Findings    ECG personally reviewed by me today - EKG Interpretation Date/Time:  Monday December 30 2024 11:27:23 EST Ventricular Rate:  52 PR Interval:  172 QRS Duration:  94 QT Interval:  446 QTC Calculation: 414 R Axis:   94  Text Interpretation: Sinus bradycardia Nonspecific ST and T wave abnormality When compared with ECG of 02-Apr-2024 14:37, No significant change was found Confirmed by  Daneen Perkins ([id number]) on 12/30/2024 11:28:54 AM  - no acute changes.   Lab Results  Component Value Date   WBC 5.6 03/23/2024   HGB 11.7 (L) 03/23/2024   HCT 35.3 (L) 03/23/2024   MCV 87.4 03/23/2024   PLT 174 03/23/2024   Lab Results  Component Value Date   CREATININE 1.21 (H) 07/15/2024   BUN 24 07/15/2024   NA 140 07/15/2024   K 4.1 07/15/2024   CL 104 07/15/2024   CO2 25 07/15/2024   Lab Results  Component Value Date   ALT 18 07/15/2024   AST 26 07/15/2024   ALKPHOS 82 05/24/2024   BILITOT 0.4 07/15/2024   Lab Results  Component Value Date   CHOL 125 05/30/2024   HDL 47 05/30/2024   LDLCALC 58 05/30/2024   TRIG 108 05/30/2024   CHOLHDL 2.7  05/30/2024    Lab Results  Component Value Date   HGBA1C 5.8 (H) 01/20/2012    Assessment & Plan    1. CAD: Coronary CT angiogram in 07/2023 revealed coronary calcium  score of 1195 (90 percentile, severe RCA stenosis, mild LAD stenosis.  She continued to report left-sided chest pain, exertional dyspnea.  She underwent LHC in April 2025, which revealed 80% stenosis in the proximal RCA-1, 80% stenosis in proximal RCA-2, and 50% mid RCA stenosis, s/p balloon angioplasty, shockwave lithotripsy, DES-RCA.  She continues to report chest discomfort that radiates to her back and left arm.  The pain has been nearly constant for the past year.  Not exacerbated by physical exertion.  She does note some mild dyspnea on exertion, stable compared to prior visits.  Question musculoskeletal etiology given severe scoliosis.  However, through shared decision making, will increase Imdur  to 60 mg daily.  Continue to monitor for progressive symptoms.  Will update echocardiogram as below.  Reviewed ED precautions.  Continue Plavix , amlodipine -olmesartan , Imdur  as above, and Crestor .   2. Palpitations: Cardiac monitor in 09/2024 revealed predominantly normal sinus rhythm, recent atrial tachycardia, longest episode lasting 5 beats, rare PACs and PVCs.  She reports occasional fleeting palpitations, denies any associated symptoms.  Stable.  3. Aortic valve regurgitation/aortic stenosis:  Echocardiogram in 04/2024 showed EF 60 to 65%, normal LV function, no RWMA, mild concentric LVH, normal RV systolic function, mild to moderate aortic valve regurgitation, mild aortic valve stenosis, mean gradient 10 mmHg.  She reports stable mild dyspnea on exertion, euvolemic and well compensated on exam.  Will repeat echocardiogram in 04/2025 for monitoring.   4. Carotid artery stenosis: Carotid ultrasound in 03/2024 revealed 1 to 39% BICA stenosis.  Asymptomatic.  Consider repeat ultrasound as clinically indicated.  Continue Crestor .   5.  Hypertension: BP slightly elevated in office today, generally well-controlled.  Will increase Imdur  as above.  Otherwise, continue current antihypertensive regimen.    6. Hyperlipidemia: LDL was 58 in 05/2024. Continue Crestor .   7. Tobacco use: She continues to smoke. Full cessation advised.    8. Disposition: Follow-up with Dr. Anner in May/June 2026 following echocardiogram.    HYPERTENSION CONTROL Vitals:   12/30/24 1122 12/30/24 1147  BP: (!) 152/70 (!) 140/72    The patient's blood pressure is elevated above target today.  In order to address the patient's elevated BP: Blood pressure will be monitored at home to determine if medication changes need to be made.; Follow up with general cardiology has been recommended.      Perkins JAYSON Daneen, NP 12/30/2024, 1:45 PM       [1]  Allergies Allergen Reactions   Labetalol     Seizures    Advil  [Ibuprofen ]     Kidney pain    Lyrica [Pregabalin]     Makes her very groggy   "

## 2024-12-30 NOTE — Patient Instructions (Signed)
 Medication Instructions: Increase Imdur  60 mg daily  *If you need a refill on your cardiac medications before your next appointment, please call your pharmacy*  Lab Work: NONE ordered at this time of appointment   Testing/Procedures: Your physician has requested that you have an echocardiogram May 2026. Echocardiography is a painless test that uses sound waves to create images of your heart. It provides your doctor with information about the size and shape of your heart and how well your hearts chambers and valves are working. This procedure takes approximately one hour. There are no restrictions for this procedure. Please do NOT wear cologne, perfume, aftershave, or lotions (deodorant is allowed). Please arrive 15 minutes prior to your appointment time.  Please note: We ask at that you not bring children with you during ultrasound (echo/ vascular) testing. Due to room size and safety concerns, children are not allowed in the ultrasound rooms during exams. Our front office staff cannot provide observation of children in our lobby area while testing is being conducted. An adult accompanying a patient to their appointment will only be allowed in the ultrasound room at the discretion of the ultrasound technician under special circumstances. We apologize for any inconvenience.   Follow-Up: At [facility], you and your health needs are our priority.  As part of our continuing mission to provide you with exceptional heart care, our providers are all part of one team.  This team includes your primary Cardiologist (physician) and Advanced Practice Providers or APPs (Physician Assistants and Nurse Practitioners) who all work together to provide you with the care you need, when you need it.  Your next appointment:    May or June 2026 after echo.   Provider:   Alm Clay, MD or Scot Ford, PA-C or Damien Braver, NP          We recommend signing up for the patient portal called MyChart.   Sign up information is provided on this After Visit Summary.  MyChart is used to connect with patients for Virtual Visits (Telemedicine).  Patients are able to view lab/test results, encounter notes, upcoming appointments, etc.  Non-urgent messages can be sent to your provider as well.   To learn more about what you can do with MyChart, go to [email or web address].   Other Instructions

## 2025-01-02 ENCOUNTER — Encounter (INDEPENDENT_AMBULATORY_CARE_PROVIDER_SITE_OTHER): Payer: Self-pay | Admitting: *Deleted

## 2025-01-06 ENCOUNTER — Other Ambulatory Visit: Payer: Self-pay | Admitting: Orthopedic Surgery

## 2025-01-06 ENCOUNTER — Telehealth: Payer: Self-pay

## 2025-01-06 MED ORDER — VARENICLINE TARTRATE 1 MG PO TABS: ORAL_TABLET | ORAL | 0 refills | Status: DC

## 2025-01-06 NOTE — Telephone Encounter (Unsigned)
 Copied from CRM #[id number]. Topic: Clinical - Prescription Issue >> Jan 06, 2025  3:55 PM Alfonso ORN wrote: Reason for CRM: H[facility] with [facility] recieved an electronic prescription from [facility], for the Chantix  and pharmacy requesting a  prescription change tor the  starter pak  mention did not sense to split    [phone]

## 2025-01-06 NOTE — Telephone Encounter (Signed)
 Starter pack for Chantix  sent to pharmacy. Recommend starting when you have cut down smoking. It is encouraged you start medication before you have completely quit smoking. I have only sent 1st month starter dose. Recommend committing x 3 months. If you make it past 3-4 weeks, please contact us  and we can refill medication.

## 2025-01-06 NOTE — Telephone Encounter (Signed)
 Copied from CRM [id number]. Topic: Clinical - Medication Refill >> Jan 06, 2025  2:23 PM Chiquita SQUIBB wrote: Medication: varenicline  (CHANTIX ) 1 MG tablet  Patient is asking for a refill as she didn't stop smoking until the last two weeks and stated it was not long enough so she is asking for a refill.   Has the patient contacted their pharmacy? Yes (Agent: If no, request that the patient contact the pharmacy for the refill. If patient does not wish to contact the pharmacy document the reason why and proceed with request.) (Agent: If yes, when and what did the pharmacy advise?)  This is the patient's preferred pharmacy:  [facility] - [place], [place] - [address] [place] [address] Phone: [phone] Fax: [phone]  Is this the correct pharmacy for this prescription? Yes If no, delete pharmacy and type the correct one.   Has the prescription been filled recently? No  Is the patient out of the medication? Yes  Has the patient been seen for an appointment in the last year OR does the patient have an upcoming appointment? Yes  Can we respond through MyChart? Yes  Agent: Please be advised that Rx refills may take up to 3 business days. We ask that you follow-up with your pharmacy.

## 2025-01-07 ENCOUNTER — Other Ambulatory Visit: Payer: Self-pay | Admitting: Orthopedic Surgery

## 2025-01-07 DIAGNOSIS — F17200 Nicotine dependence, unspecified, uncomplicated: Secondary | ICD-10-CM

## 2025-01-07 MED ORDER — VARENICLINE TARTRATE (STARTER) 0.5 MG X 11 & 1 MG X 42 PO TBPK: ORAL_TABLET | ORAL | 0 refills | Status: AC

## 2025-01-07 NOTE — Telephone Encounter (Signed)
Chantix starter pack sent to pharmacy

## 2025-01-20 ENCOUNTER — Other Ambulatory Visit: Payer: Self-pay | Admitting: Orthopedic Surgery

## 2025-01-20 DIAGNOSIS — G43109 Migraine with aura, not intractable, without status migrainosus: Secondary | ICD-10-CM

## 2025-01-20 DIAGNOSIS — G43909 Migraine, unspecified, not intractable, without status migrainosus: Secondary | ICD-10-CM

## 2025-01-29 ENCOUNTER — Other Ambulatory Visit

## 2025-04-29 ENCOUNTER — Ambulatory Visit (HOSPITAL_COMMUNITY)

## 2025-05-15 ENCOUNTER — Ambulatory Visit: Admitting: Nurse Practitioner

## 2025-06-03 ENCOUNTER — Ambulatory Visit: Admitting: "Endocrinology

## 2025-06-26 ENCOUNTER — Ambulatory Visit: Admitting: Orthopedic Surgery
